# Patient Record
Sex: Male | Born: 1970 | Race: White | Hispanic: No | Marital: Single | Smoking: Never smoker
Health system: Southern US, Community
[De-identification: ages and names within clinical notes are randomized; demographics above are authoritative.]

## PROBLEM LIST (undated history)

## (undated) HISTORY — PX: TONSILLECTOMY: SUR1361

---

## 2001-08-05 ENCOUNTER — Encounter: Payer: Self-pay | Admitting: Emergency Medicine

## 2001-08-05 ENCOUNTER — Emergency Department (HOSPITAL_COMMUNITY): Admission: EM | Admit: 2001-08-05 | Discharge: 2001-08-05 | Payer: Self-pay | Admitting: Emergency Medicine

## 2004-03-25 ENCOUNTER — Ambulatory Visit (HOSPITAL_COMMUNITY)
Admission: RE | Admit: 2004-03-25 | Discharge: 2004-03-25 | Payer: Self-pay | Admitting: Physical Medicine and Rehabilitation

## 2005-01-27 ENCOUNTER — Ambulatory Visit: Payer: Self-pay | Admitting: Family Medicine

## 2020-07-21 ENCOUNTER — Ambulatory Visit (HOSPITAL_COMMUNITY)
Admission: RE | Admit: 2020-07-21 | Discharge: 2020-07-21 | Disposition: A | Discharge status: Home / Self Care | Payer: Self-pay | Source: Ambulatory Visit | Attending: Urology | Admitting: Urology

## 2020-07-21 ENCOUNTER — Encounter: Payer: Self-pay | Admitting: Urology

## 2020-07-21 ENCOUNTER — Other Ambulatory Visit: Payer: Self-pay

## 2020-07-21 ENCOUNTER — Ambulatory Visit (INDEPENDENT_AMBULATORY_CARE_PROVIDER_SITE_OTHER): Payer: Self-pay | Admitting: Urology

## 2020-07-21 VITALS — BP 128/81 | HR 73 | Temp 98.9°F | Ht 67.75 in | Wt 280.0 lb

## 2020-07-21 DIAGNOSIS — N200 Calculus of kidney: Secondary | ICD-10-CM

## 2020-07-21 LAB — URINALYSIS, ROUTINE W REFLEX MICROSCOPIC
Bilirubin, UA: NEGATIVE
Glucose, UA: NEGATIVE
Ketones, UA: NEGATIVE
Nitrite, UA: NEGATIVE
Specific Gravity, UA: 1.025 (ref 1.005–1.030)
Urobilinogen, Ur: 0.2 mg/dL (ref 0.2–1.0)
pH, UA: 5.5 (ref 5.0–7.5)

## 2020-07-21 LAB — MICROSCOPIC EXAMINATION: Renal Epithel, UA: NONE SEEN /hpf

## 2020-07-21 NOTE — Patient Instructions (Addendum)
Pre op at [facility] 07/22/2020 at 10:30 Surgery- 07/23/2020   Ureteroscopy Ureteroscopy is a procedure to check for and treat problems inside part of the urinary tract. In this procedure, a thin, tube-shaped instrument with a light at the end (ureteroscope) is used to look at the inside of the kidneys and the ureters, which are the tubes that carry urine from the kidneys to the bladder. The ureteroscope is inserted into one or both of the ureters. You may need this procedure if you have frequent urinary tract infections (UTIs), blood in your urine, or a stone in one of your ureters. A ureteroscopy can be done to find the cause of urine blockage in a ureter and to evaluate other abnormalities inside the ureters or kidneys. If stones are found, they can be removed during the procedure. Polyps, abnormal tissue, and some types of tumors can also be removed or treated. The ureteroscope may also have a tool to remove tissue to be checked for disease under a microscope (biopsy). Tell a health care provider about:  Any allergies you have.  All medicines you are taking, including vitamins, herbs, eye drops, creams, and over-the-counter medicines.  Any problems you or family members have had with anesthetic medicines.  Any blood disorders you have.  Any surgeries you have had.  Any medical conditions you have.  Whether you are pregnant or may be pregnant. What are the risks? Generally, this is a safe procedure. However, problems may occur, including:  Bleeding.  Infection.  Allergic reactions to medicines.  Scarring that narrows the ureter (stricture).  Creating a hole in the ureter (perforation). What happens before the procedure? Staying hydrated Follow instructions from your health care provider about hydration, which may include:  Up to 2 hours before the procedure - you may continue to drink clear liquids, such as water, clear fruit juice, black coffee, and plain tea. Eating and  drinking restrictions Follow instructions from your health care provider about eating and drinking, which may include:  8 hours before the procedure - stop eating heavy meals or foods such as meat, fried foods, or fatty foods.  6 hours before the procedure - stop eating light meals or foods, such as toast or cereal.  6 hours before the procedure - stop drinking milk or drinks that contain milk.  2 hours before the procedure - stop drinking clear liquids. Medicines  Ask your health care provider about: ? Changing or stopping your regular medicines. This is especially important if you are taking diabetes medicines or blood thinners. ? Taking medicines such as aspirin and ibuprofen. These medicines can thin your blood. Do not take these medicines before your procedure if your health care provider instructs you not to.  You may be given antibiotic medicine to help prevent infection. General instructions  You may have a urine sample taken to check for infection.  Plan to have someone take you home from the hospital or clinic. What happens during the procedure?   To reduce your risk of infection: ? Your health care team will wash or sanitize their hands. ? Your skin will be washed with soap.  An IV tube will be inserted into one of your veins.  You will be given one of the following: ? A medicine to help you relax (sedative). ? A medicine to make you fall asleep (general anesthetic). ? A medicine that is injected into your spine to numb the area below and slightly above the injection site (spinal anesthetic).  To  lower your risk of infection, you may be given an antibiotic medicine by an injection or through the IV tube.  The opening from which you urinate (urethra) will be cleaned with a germ-killing solution.  The ureteroscope will be passed through your urethra into your bladder.  A salt-water solution will flow through the ureteroscope to fill your bladder. This will help the  health care provider see the openings of your ureters more clearly.  Then, the ureteroscope will be passed into your ureter. ? If a growth is found, a piece of it may be removed so it can be examined under a microscope (biopsy). ? If a stone is found, it may be removed through the ureteroscope, or the stone may be broken up using a laser, shock waves, or electrical energy. ? In some cases, if the ureter is too small, a tube may be inserted that keeps the ureter open (ureteral stent). The stent may be left in place for 1 or 2 weeks to keep the ureter open, and then the ureteroscopy procedure will be performed.  The scope will be removed, and your bladder will be emptied. The procedure may vary among health care providers and hospitals. What happens after the procedure?  Your blood pressure, heart rate, breathing rate, and blood oxygen level will be monitored until the medicines you were given have worn off.  You may be asked to urinate.  Donot drive for 24 hours if you were given a sedative. This information is not intended to replace advice given to you by your health care provider. Make sure you discuss any questions you have with your health care provider. Document Revised: 06/23/2017 Document Reviewed: 04/22/2016 Elsevier Patient Education  2020 [employer].

## 2020-07-21 NOTE — Addendum Note (Signed)
Addended by: Dalia Heading on: 07/21/2020 04:17 PM   Modules accepted: Orders

## 2020-07-21 NOTE — Progress Notes (Signed)
Urological Symptom Review  Patient is experiencing the following symptoms: Kidney stones   Review of Systems  Gastrointestinal (upper)  : Negative for upper GI symptoms  Gastrointestinal (lower) : Negative for lower GI symptoms  Constitutional : Negative for symptoms  Skin: Negative for skin symptoms  Eyes: Negative for eye symptoms  Ear/Nose/Throat : Negative for Ear/Nose/Throat symptoms  Hematologic/Lymphatic: Negative for Hematologic/Lymphatic symptoms  Cardiovascular : Negative for cardiovascular symptoms  Respiratory : Negative for respiratory symptoms  Endocrine: Negative for endocrine symptoms  Musculoskeletal: Negative for musculoskeletal symptoms  Neurological: Negative for neurological symptoms  Psychologic: Negative for psychiatric symptoms  

## 2020-07-21 NOTE — Progress Notes (Signed)
07/21/2020 3:13 PM   [name] 1971/03/29 [id number]  Referring provider: No referring provider defined for this encounter.  Nephrolithiasis  HPI: William Anthony is a 49yo here for evaluation of nephrolithiasis. He has previously been seen in [place] for a 18mm right renal calculus and a 27mm left renal calculus. He has never had intervention for calculi in the past. The pain has been intermittent, sharp, mild to moderate and nonradiating for 2 years. He denies any LUTS. No gross hematuria. UA today shows 3-10 RBCs. No fevers. No other associated symptoms. Last imaging was a CT stone protocol from 01/23/2020.    PMH: History reviewed. No pertinent past medical history.  Surgical History: History reviewed. No pertinent surgical history.  Home Medications:  Allergies as of 07/21/2020   Not on File     Medication List    as of July 21, 2020  3:13 PM   You have not been prescribed any medications.     Allergies: Not on File  Family History: Family History  Problem Relation Age of Onset  . Heart attack Father     Social History:  reports that he has never smoked. He has never used smokeless tobacco. No history on file for alcohol use and drug use.  ROS: All other review of systems were reviewed and are negative except what is noted above in HPI  Physical Exam: BP 128/81   Pulse 73   Temp 98.9 F (37.2 C)   Ht 5' 7.75" (1.721 m)   Wt 280 lb (127 kg)   BMI 42.89 kg/m   Constitutional:  Alert and oriented, No acute distress. HEENT: [place] AT, moist mucus membranes.  Trachea midline, no masses. Cardiovascular: No clubbing, cyanosis, or edema. Respiratory: Normal respiratory effort, no increased work of breathing. GI: Abdomen is soft, nontender, nondistended, no abdominal masses GU: No CVA tenderness.  Lymph: No cervical or inguinal lymphadenopathy. Skin: No rashes, bruises or suspicious lesions. Neurologic: Grossly intact, no focal deficits, moving all 4  extremities. Psychiatric: Normal mood and affect.  Laboratory Data: No results found for: WBC, HGB, HCT, MCV, PLT  No results found for: CREATININE  No results found for: PSA  No results found for: TESTOSTERONE  No results found for: HGBA1C  Urinalysis No results found for: COLORURINE, APPEARANCEUR, LABSPEC, PHURINE, GLUCOSEU, HGBUR, BILIRUBINUR, KETONESUR, PROTEINUR, UROBILINOGEN, NITRITE, LEUKOCYTESUR  No results found for: LABMICR, WBCUA, RBCUA, LABEPIT, MUCUS, BACTERIA  Pertinent Imaging: [facility] today: Images reviewed and discussed with the patient No results found for this or any previous visit.  No results found for this or any previous visit.  No results found for this or any previous visit.  No results found for this or any previous visit.  No results found for this or any previous visit.  No results found for this or any previous visit.  No results found for this or any previous visit.  No results found for this or any previous visit.   Assessment & Plan:    1. Nephrolithiasis -We discussed the management of kidney stones. These options include observation, ureteroscopy, shockwave lithotripsy (ESWL) and percutaneous nephrolithotomy (PCNL). We discussed which options are relevant to the patient's stone(s). We discussed the natural history of kidney stones as well as the complications of untreated stones and the impact on quality of life without treatment as well as with each of the above listed treatments. We also discussed the efficacy of each treatment in its ability to clear the stone burden. With any of these  management options I discussed the signs and symptoms of infection and the need for emergent treatment should these be experienced. For each option we discussed the ability of each procedure to clear the patient of their stone burden.   For observation I described the risks which include but are not limited to silent renal damage, life-threatening infection,  need for emergent surgery, failure to pass stone and pain.   For ureteroscopy I described the risks which include bleeding, infection, damage to contiguous structures, positioning injury, ureteral stricture, ureteral avulsion, ureteral injury, need for prolonged ureteral stent, inability to perform ureteroscopy, need for an interval procedure, inability to clear stone burden, stent discomfort/pain, heart attack, stroke, pulmonary embolus and the inherent risks with general anesthesia.   For shockwave lithotripsy I described the risks which include arrhythmia, kidney contusion, kidney hemorrhage, need for transfusion, pain, inability to adequately break up stone, inability to pass stone fragments, Steinstrasse, infection associated with obstructing stones, need for alternate surgical procedure, need for repeat shockwave lithotripsy, MI, CVA, PE and the inherent risks with anesthesia/conscious sedation.   For PCNL I described the risks including positioning injury, pneumothorax, hydrothorax, need for chest tube, inability to clear stone burden, renal laceration, arterial venous fistula or malformation, need for embolization of kidney, loss of kidney or renal function, need for repeat procedure, need for prolonged nephrostomy tube, ureteral avulsion, MI, CVA, PE and the inherent risks of general anesthesia.   - The patient would like to proceed with right ureteroscopic stone extraction - Abdomen 1 view (KUB)   No follow-ups on file.  Wilkie Aye, MD  [facility] [place]

## 2020-07-22 ENCOUNTER — Other Ambulatory Visit (HOSPITAL_COMMUNITY)
Admission: RE | Admit: 2020-07-22 | Discharge: 2020-07-22 | Disposition: A | Discharge status: Home / Self Care | Payer: Self-pay | Source: Ambulatory Visit | Attending: Urology | Admitting: Urology

## 2020-07-22 ENCOUNTER — Encounter (HOSPITAL_COMMUNITY)
Admission: RE | Admit: 2020-07-22 | Discharge: 2020-07-22 | Disposition: A | Discharge status: Home / Self Care | Payer: Self-pay | Source: Ambulatory Visit | Attending: Urology | Admitting: Urology

## 2020-07-22 ENCOUNTER — Other Ambulatory Visit: Payer: Self-pay

## 2020-07-22 ENCOUNTER — Encounter (HOSPITAL_COMMUNITY): Payer: Self-pay

## 2020-07-22 DIAGNOSIS — Z20822 Contact with and (suspected) exposure to covid-19: Secondary | ICD-10-CM | POA: Insufficient documentation

## 2020-07-22 DIAGNOSIS — Z01812 Encounter for preprocedural laboratory examination: Secondary | ICD-10-CM | POA: Insufficient documentation

## 2020-07-22 NOTE — Patient Instructions (Signed)
William Anthony  07/22/2020     @PREFPERIOPPHARMACY @   Your procedure is scheduled on 07/23/2120.  Report to [facility] at [facility]:00 [facility].M.  Call this number if you have problems the morning of surgery:  [phone]   Remember:  Do not eat or drink after midnight.     Take these medicines the morning of surgery with A SIP OF WATER :     Do not wear jewelry, make-up or nail polish.  Do not wear lotions, powders, or perfumes, or deodorant.  Do not shave 48 hours prior to surgery.  Men may shave face and neck.  Do not bring valuables to the hospital.  [facility] is not responsible for any belongings or valuables.  Contacts, dentures or bridgework may not be worn into surgery.  Leave your suitcase in the car.  After surgery it may be brought to your room.  For patients admitted to the hospital, discharge time will be determined by your treatment team.  Patients discharged the day of surgery will not be allowed to drive home.   Name and phone number of your driver:   family Special instructions:  n/a  Please read over the following fact sheets that you were given. Care and Recovery After Surgery    Ureteral Stent Implantation  Ureteral stent implantation is a procedure to insert (implant) a flexible, soft, plastic tube (stent) into a ureter. Ureters are the tube-like parts of the body that drain urine from the kidneys. The stent supports the ureter while it heals and helps to drain urine. You may have a ureteral stent implanted after having a procedure to remove a blockage from the ureter (ureterolysis or pyeloplasty). You may also have a stent implanted to open the flow of urine when you have a blockage caused by a kidney stone, tumor, blood clot, or infection. You have two ureters, one on each side of the body. The ureters connect the kidneys to the organ that holds urine until it passes out of the body (bladder). The stent is placed so that one end is in the kidney, and one end is  in the bladder. The stent is usually taken out after your ureter has healed. Depending on your condition, you may have a stent for just a few weeks, or you may have a long-term stent that will need to be replaced every few months. Tell a health care provider about:  Any allergies you have.  All medicines you are taking, including vitamins, herbs, eye drops, creams, and over-the-counter medicines.  Any problems you or family members have had with anesthetic medicines.  Any blood disorders you have.  Any surgeries you have had.  Any medical conditions you have.  Whether you are pregnant or may be pregnant. What are the risks? Generally, this is a safe procedure. However, problems may occur, including:  Infection.  Bleeding.  Allergic reactions to medicines.  Damage to other structures or organs. Tearing (perforation) of the ureter is possible.  Movement of the stent away from where it is placed during surgery (migration). What happens before the procedure? Medicines Ask your health care provider about:  Changing or stopping your regular medicines. This is especially important if you are taking diabetes medicines or blood thinners.  Taking medicines such as aspirin and ibuprofen. These medicines can thin your blood. Do not take these medicines unless your health care provider tells you to take them.  Taking over-the-counter medicines, vitamins, herbs, and supplements. Eating and drinking Follow  instructions from your health care provider about eating and drinking, which may include:  8 hours before the procedure - stop eating heavy meals or foods, such as meat, fried foods, or fatty foods.  6 hours before the procedure - stop eating light meals or foods, such as toast or cereal.  6 hours before the procedure - stop drinking milk or drinks that contain milk.  2 hours before the procedure - stop drinking clear liquids. Staying hydrated Follow instructions from your health  care provider about hydration, which may include:  Up to 2 hours before the procedure - you may continue to drink clear liquids, such as water, clear fruit juice, black coffee, and plain tea. General instructions  Do not drink alcohol.  Do not use any products that contain nicotine or tobacco for at least 4 weeks before the procedure. These products include cigarettes, e-cigarettes, and chewing tobacco. If you need help quitting, ask your health care provider.  You may have an exam or testing, such as imaging or blood tests.  Ask your health care provider what steps will be taken to help prevent infection. These may include: ? Removing hair at the surgery site. ? Washing skin with a germ-killing soap. ? Taking antibiotic medicine.  Plan to have someone take you home from the hospital or clinic.  If you will be going home right after the procedure, plan to have someone with you for 24 hours. What happens during the procedure?  An IV will be inserted into one of your veins.  You may be given a medicine to help you relax (sedative).  You may be given a medicine to make you fall asleep (general anesthetic).  A thin, tube-shaped instrument with a light and tiny camera at the end (cystoscope) will be inserted into your urethra. The urethra is the tube that drains urine from the bladder out of the body. In men, the urethra opens at the end of the penis. In women, the urethra opens in front of the vaginal opening.  The cystoscope will be passed into your bladder.  A thin wire (guide wire) will be passed through your bladder and into your ureter. This is used to guide the stent into your ureter.  The stent will be inserted into your ureter.  The guide wire and the cystoscope will be removed.  A flexible tube (catheter) may be inserted through your urethra so that one end is in your bladder. This helps to drain urine from your bladder. The procedure may vary among hospitals and health  care providers. What happens after the procedure?  Your blood pressure, heart rate, breathing rate, and blood oxygen level will be monitored until you leave the hospital or clinic.  You may continue to receive medicine and fluids through an IV.  You may have some soreness or pain in your abdomen and urethra. Medicines will be available to help you.  You will be encouraged to get up and walk around as soon as you can.  You may have a catheter draining your urine.  You will have some blood in your urine.  Do not drive for 24 hours if you were given a sedative during your procedure. Summary  Ureteral stent implantation is a procedure to insert a flexible, soft, plastic tube (stent) into a ureter.  You may have a stent implanted to support the ureter while it heals after a procedure or to open the flow of urine if there is a blockage.  Follow instructions from  your health care provider about taking medicines and about eating and drinking before the procedure.  Depending on your condition, you may have a stent for just a few weeks, or you may have a long-term stent that will need to be replaced every few months. This information is not intended to replace advice given to you by your health care provider. Make sure you discuss any questions you have with your health care provider. Document Revised: 04/17/2018 Document Reviewed: 04/18/2018 Elsevier Patient Education  2020 [employer].  Ureteroscopy Ureteroscopy is a procedure to check for and treat problems inside part of the urinary tract. In this procedure, a thin, tube-shaped instrument with a light at the end (ureteroscope) is used to look at the inside of the kidneys and the ureters, which are the tubes that carry urine from the kidneys to the bladder. The ureteroscope is inserted into one or both of the ureters. You may need this procedure if you have frequent urinary tract infections (UTIs), blood in your urine, or a stone in one of  your ureters. A ureteroscopy can be done to find the cause of urine blockage in a ureter and to evaluate other abnormalities inside the ureters or kidneys. If stones are found, they can be removed during the procedure. Polyps, abnormal tissue, and some types of tumors can also be removed or treated. The ureteroscope may also have a tool to remove tissue to be checked for disease under a microscope (biopsy). Tell a health care provider about:  Any allergies you have.  All medicines you are taking, including vitamins, herbs, eye drops, creams, and over-the-counter medicines.  Any problems you or family members have had with anesthetic medicines.  Any blood disorders you have.  Any surgeries you have had.  Any medical conditions you have.  Whether you are pregnant or may be pregnant. What are the risks? Generally, this is a safe procedure. However, problems may occur, including:  Bleeding.  Infection.  Allergic reactions to medicines.  Scarring that narrows the ureter (stricture).  Creating a hole in the ureter (perforation). What happens before the procedure? Staying hydrated Follow instructions from your health care provider about hydration, which may include:  Up to 2 hours before the procedure - you may continue to drink clear liquids, such as water, clear fruit juice, black coffee, and plain tea. Eating and drinking restrictions Follow instructions from your health care provider about eating and drinking, which may include:  8 hours before the procedure - stop eating heavy meals or foods such as meat, fried foods, or fatty foods.  6 hours before the procedure - stop eating light meals or foods, such as toast or cereal.  6 hours before the procedure - stop drinking milk or drinks that contain milk.  2 hours before the procedure - stop drinking clear liquids. Medicines  Ask your health care provider about: ? Changing or stopping your regular medicines. This is especially  important if you are taking diabetes medicines or blood thinners. ? Taking medicines such as aspirin and ibuprofen. These medicines can thin your blood. Do not take these medicines before your procedure if your health care provider instructs you not to.  You may be given antibiotic medicine to help prevent infection. General instructions  You may have a urine sample taken to check for infection.  Plan to have someone take you home from the hospital or clinic. What happens during the procedure?   To reduce your risk of infection: ? Your health care  team will wash or sanitize their hands. ? Your skin will be washed with soap.  An IV tube will be inserted into one of your veins.  You will be given one of the following: ? A medicine to help you relax (sedative). ? A medicine to make you fall asleep (general anesthetic). ? A medicine that is injected into your spine to numb the area below and slightly above the injection site (spinal anesthetic).  To lower your risk of infection, you may be given an antibiotic medicine by an injection or through the IV tube.  The opening from which you urinate (urethra) will be cleaned with a germ-killing solution.  The ureteroscope will be passed through your urethra into your bladder.  A salt-water solution will flow through the ureteroscope to fill your bladder. This will help the health care provider see the openings of your ureters more clearly.  Then, the ureteroscope will be passed into your ureter. ? If a growth is found, a piece of it may be removed so it can be examined under a microscope (biopsy). ? If a stone is found, it may be removed through the ureteroscope, or the stone may be broken up using a laser, shock waves, or electrical energy. ? In some cases, if the ureter is too small, a tube may be inserted that keeps the ureter open (ureteral stent). The stent may be left in place for 1 or 2 weeks to keep the ureter open, and then the  ureteroscopy procedure will be performed.  The scope will be removed, and your bladder will be emptied. The procedure may vary among health care providers and hospitals. What happens after the procedure?  Your blood pressure, heart rate, breathing rate, and blood oxygen level will be monitored until the medicines you were given have worn off.  You may be asked to urinate.  Donot drive for 24 hours if you were given a sedative. This information is not intended to replace advice given to you by your health care provider. Make sure you discuss any questions you have with your health care provider. Document Revised: 06/23/2017 Document Reviewed: 04/22/2016 Elsevier Patient Education  2020 [employer]  Cystoscopy Cystoscopy is a procedure that is used to help diagnose and sometimes treat conditions that affect the lower urinary tract. The lower urinary tract includes the bladder and the urethra. The urethra is the tube that drains urine from the bladder. Cystoscopy is done using a thin, tube-shaped instrument with a light and camera at the end (cystoscope). The cystoscope may be hard or flexible, depending on the goal of the procedure. The cystoscope is inserted through the urethra, into the bladder. Cystoscopy may be recommended if you have:  Urinary tract infections that keep coming back.  Blood in the urine (hematuria).  An inability to control when you urinate (urinary incontinence) or an overactive bladder.  Unusual cells found in a urine sample.  A blockage in the urethra, such as a urinary stone.  Painful urination.  An abnormality in the bladder found during an intravenous pyelogram (IVP) or CT scan. Cystoscopy may also be done to remove a sample of tissue to be examined under a microscope (biopsy). Tell a health care provider about:  Any allergies you have.  All medicines you are taking, including vitamins, herbs, eye drops, creams, and over-the-counter medicines.  Any  problems you or family members have had with anesthetic medicines.  Any blood disorders you have.  Any surgeries you have had.  Any  medical conditions you have.  Whether you are pregnant or may be pregnant. What are the risks? Generally, this is a safe procedure. However, problems may occur, including:  Infection.  Bleeding.  Allergic reactions to medicines.  Damage to other structures or organs. What happens before the procedure?  Ask your health care provider about: ? Changing or stopping your regular medicines. This is especially important if you are taking diabetes medicines or blood thinners. ? Taking medicines such as aspirin and ibuprofen. These medicines can thin your blood. Do not take these medicines unless your health care provider tells you to take them. ? Taking over-the-counter medicines, vitamins, herbs, and supplements.  Follow instructions from your health care provider about eating or drinking restrictions.  Ask your health care provider what steps will be taken to help prevent infection. These may include: ? Washing skin with a germ-killing soap. ? Taking antibiotic medicine.  You may have an exam or testing, such as: ? X-rays of the bladder, urethra, or kidneys. ? Urine tests to check for signs of infection.  Plan to have someone take you home from the hospital or clinic. What happens during the procedure?   You will be given one or more of the following: ? A medicine to help you relax (sedative). ? A medicine to numb the area (local anesthetic).  The area around the opening of your urethra will be cleaned.  The cystoscope will be passed through your urethra into your bladder.  Germ-free (sterile) fluid will flow through the cystoscope to fill your bladder. The fluid will stretch your bladder so that your health care provider can clearly examine your bladder walls.  Your doctor will look at the urethra and bladder. Your doctor may take a biopsy or  remove stones.  The cystoscope will be removed, and your bladder will be emptied. The procedure may vary among health care providers and hospitals. What can I expect after the procedure? After the procedure, it is common to have:  Some soreness or pain in your abdomen and urethra.  Urinary symptoms. These include: ? Mild pain or burning when you urinate. Pain should stop within a few minutes after you urinate. This may last for up to 1 week. ? A small amount of blood in your urine for several days. ? Feeling like you need to urinate but producing only a small amount of urine. Follow these instructions at home: Medicines  Take over-the-counter and prescription medicines only as told by your health care provider.  If you were prescribed an antibiotic medicine, take it as told by your health care provider. Do not stop taking the antibiotic even if you start to feel better. General instructions  Return to your normal activities as told by your health care provider. Ask your health care provider what activities are safe for you.  Do not drive for 24 hours if you were given a sedative during your procedure.  Watch for any blood in your urine. If the amount of blood in your urine increases, call your health care provider.  Follow instructions from your health care provider about eating or drinking restrictions.  If a tissue sample was removed for testing (biopsy) during your procedure, it is up to you to get your test results. Ask your health care provider, or the department that is doing the test, when your results will be ready.  Drink enough fluid to keep your urine pale yellow.  Keep all follow-up visits as told by your health care provider.  This is important. Contact a health care provider if you:  Have pain that gets worse or does not get better with medicine, especially pain when you urinate.  Have trouble urinating.  Have more blood in your urine. Get help right away if  you:  Have blood clots in your urine.  Have abdominal pain.  Have a fever or chills.  Are unable to urinate. Summary  Cystoscopy is a procedure that is used to help diagnose and sometimes treat conditions that affect the lower urinary tract.  Cystoscopy is done using a thin, tube-shaped instrument with a light and camera at the end.  After the procedure, it is common to have some soreness or pain in your abdomen and urethra.  Watch for any blood in your urine. If the amount of blood in your urine increases, call your health care provider.  If you were prescribed an antibiotic medicine, take it as told by your health care provider. Do not stop taking the antibiotic even if you start to feel better. This information is not intended to replace advice given to you by your health care provider. Make sure you discuss any questions you have with your health care provider. Document Revised: 07/03/2018 Document Reviewed: 07/03/2018 Elsevier Patient Education  2020 [employer].

## 2020-07-23 LAB — SARS CORONAVIRUS 2 (TAT 6-24 HRS): SARS Coronavirus 2: NEGATIVE

## 2020-08-27 NOTE — Patient Instructions (Signed)
William Anthony  08/27/2020     @PREFPERIOPPHARMACY @   Your procedure is scheduled on  09/03/2020   Report to [facility] at  1100   A.M.   Call this number if you have problems the morning of surgery:  [phone]     Remember:  Do not eat or drink after midnight.                        Take these medicines the morning of surgery with A SIP OF WATER    NONE    Please brush your teeth.  Do not wear jewelry, make-up or nail polish.  Do not wear lotions, powders, or perfumes, or deodorant.  Do not shave 48 hours prior to surgery.  Men may shave face and neck.  Do not bring valuables to the hospital.  [facility] is not responsible for any belongings or valuables.  Contacts, dentures or bridgework may not be worn into surgery.  Leave your suitcase in the car.  After surgery it may be brought to your room.  For patients admitted to the hospital, discharge time will be determined by your treatment team.  Patients discharged the day of surgery will not be allowed to drive home and must have someone with them for 24 hours.   Special instructions:  DO NOT smoke tobacco or vape the morning of your surgery.   Please read over the following fact sheets that you were given. Coughing and Deep Breathing, Surgical Site Infection Prevention, Anesthesia Post-op Instructions and Care and Recovery After Surgery       Ureteral Stent Implantation, Care After This sheet gives you information about how to care for yourself after your procedure. Your health care provider may also give you more specific instructions. If you have problems or questions, contact your health care provider. What can I expect after the procedure? After the procedure, it is common to have:  Nausea.  Mild pain when you urinate. You may feel this pain in your lower back or lower abdomen. The pain should stop within a few minutes after you urinate. This may last for up to 1 week.  A small amount of  blood in your urine for several days. Follow these instructions at home: Medicines  Take over-the-counter and prescription medicines only as told by your health care provider.  If you were prescribed an antibiotic medicine, take it as told by your health care provider. Do not stop taking the antibiotic even if you start to feel better.  Do not drive for 24 hours if you were given a sedative during your procedure.  Ask your health care provider if the medicine prescribed to you requires you to avoid driving or using heavy machinery. Activity  Rest as told by your health care provider.  Avoid sitting for a long time without moving. Get up to take short walks every 1-2 hours. This is important to improve blood flow and breathing. Ask for help if you feel weak or unsteady.  Return to your normal activities as told by your health care provider. Ask your health care provider what activities are safe for you. General instructions  Watch for any blood in your urine. Call your health care provider if the amount of blood in your urine increases.  If you have a catheter: ? Follow instructions from your health care provider about taking care of your catheter and collection bag. ? Do  not take baths, swim, or use a hot tub until your health care provider approves. Ask your health care provider if you may take showers. You may only be allowed to take sponge baths.  Drink enough fluid to keep your urine pale yellow.  Do not use any products that contain nicotine or tobacco, such as cigarettes, e-cigarettes, and chewing tobacco. These can delay healing after surgery. If you need help quitting, ask your health care provider.  Keep all follow-up visits as told by your health care provider. This is important.   Contact a health care provider if:  You have pain that gets worse or does not get better with medicine, especially pain when you urinate.  You have difficulty urinating.  You feel nauseous or  you vomit repeatedly during a period of more than 2 days after the procedure. Get help right away if:  Your urine is dark red or has blood clots in it.  You are leaking urine (have incontinence).  The end of the stent comes out of your urethra.  You cannot urinate.  You have sudden, sharp, or severe pain in your abdomen or lower back.  You have a fever.  You have swelling or pain in your legs.  You have difficulty breathing. Summary  After the procedure, it is common to have mild pain when you urinate that goes away within a few minutes after you urinate. This may last for up to 1 week.  Watch for any blood in your urine. Call your health care provider if the amount of blood in your urine increases.  Take over-the-counter and prescription medicines only as told by your health care provider.  Drink enough fluid to keep your urine pale yellow. This information is not intended to replace advice given to you by your health care provider. Make sure you discuss any questions you have with your health care provider. Document Revised: 04/17/2018 Document Reviewed: 04/18/2018 Elsevier Patient Education  2021 [employer] Monitored Anesthesia Care, Care After This sheet gives you information about how to care for yourself after your procedure. Your health care provider may also give you more specific instructions. If you have problems or questions, contact your health care provider. What can I expect after the procedure? After the procedure, it is common to have:  Tiredness.  Forgetfulness about what happened after the procedure.  Impaired judgment for important decisions.  Nausea or vomiting.  Some difficulty with balance. Follow these instructions at home: For the time period you were told by your health care provider:  Rest as needed.  Do not participate in activities where you could fall or become injured.  Do not drive or use machinery.  Do not drink alcohol.  Do  not take sleeping pills or medicines that cause drowsiness.  Do not make important decisions or sign legal documents.  Do not take care of children on your own.      Eating and drinking  Follow the diet that is recommended by your health care provider.  Drink enough fluid to keep your urine pale yellow.  If you vomit: ? Drink water, juice, or soup when you can drink without vomiting. ? Make sure you have little or no nausea before eating solid foods. General instructions  Have a responsible adult stay with you for the time you are told. It is important to have someone help care for you until you are awake and alert.  Take over-the-counter and prescription medicines only as told by your health  care provider.  If you have sleep apnea, surgery and certain medicines can increase your risk for breathing problems. Follow instructions from your health care provider about wearing your sleep device: ? Anytime you are sleeping, including during daytime naps. ? While taking prescription pain medicines, sleeping medicines, or medicines that make you drowsy.  Avoid smoking.  Keep all follow-up visits as told by your health care provider. This is important. Contact a health care provider if:  You keep feeling nauseous or you keep vomiting.  You feel light-headed.  You are still sleepy or having trouble with balance after 24 hours.  You develop a rash.  You have a fever.  You have redness or swelling around the IV site. Get help right away if:  You have trouble breathing.  You have new-onset confusion at home. Summary  For several hours after your procedure, you may feel tired. You may also be forgetful and have poor judgment.  Have a responsible adult stay with you for the time you are told. It is important to have someone help care for you until you are awake and alert.  Rest as told. Do not drive or operate machinery. Do not drink alcohol or take sleeping pills.  Get help  right away if you have trouble breathing, or if you suddenly become confused. This information is not intended to replace advice given to you by your health care provider. Make sure you discuss any questions you have with your health care provider. Document Revised: 03/26/2020 Document Reviewed: 06/13/2019 Elsevier Patient Education  2021 [employer].

## 2020-09-01 ENCOUNTER — Other Ambulatory Visit: Payer: Self-pay

## 2020-09-01 ENCOUNTER — Encounter (HOSPITAL_COMMUNITY)
Admission: RE | Admit: 2020-09-01 | Discharge: 2020-09-01 | Disposition: A | Discharge status: Home / Self Care | Payer: Self-pay | Source: Ambulatory Visit | Attending: Urology | Admitting: Urology

## 2020-09-01 ENCOUNTER — Other Ambulatory Visit (HOSPITAL_COMMUNITY)
Admission: RE | Admit: 2020-09-01 | Discharge: 2020-09-01 | Disposition: A | Discharge status: Home / Self Care | Payer: Self-pay | Source: Ambulatory Visit | Attending: Urology | Admitting: Urology

## 2020-09-01 DIAGNOSIS — Z20822 Contact with and (suspected) exposure to covid-19: Secondary | ICD-10-CM | POA: Insufficient documentation

## 2020-09-01 DIAGNOSIS — Z01812 Encounter for preprocedural laboratory examination: Secondary | ICD-10-CM | POA: Insufficient documentation

## 2020-09-01 LAB — SARS CORONAVIRUS 2 (TAT 6-24 HRS): SARS Coronavirus 2: NEGATIVE

## 2020-09-03 DIAGNOSIS — N200 Calculus of kidney: Secondary | ICD-10-CM

## 2020-09-04 ENCOUNTER — Encounter (HOSPITAL_COMMUNITY): Payer: Self-pay | Admitting: Urology

## 2020-09-04 ENCOUNTER — Ambulatory Visit (HOSPITAL_COMMUNITY): Payer: Self-pay | Admitting: Anesthesiology

## 2020-09-04 ENCOUNTER — Ambulatory Visit (HOSPITAL_COMMUNITY)
Admission: RE | Admit: 2020-09-04 | Discharge: 2020-09-04 | Disposition: A | Discharge status: Home / Self Care | Payer: Self-pay | Attending: Urology | Admitting: Urology

## 2020-09-04 ENCOUNTER — Other Ambulatory Visit: Payer: Self-pay

## 2020-09-04 ENCOUNTER — Ambulatory Visit (HOSPITAL_COMMUNITY): Payer: Self-pay

## 2020-09-04 ENCOUNTER — Encounter (HOSPITAL_COMMUNITY)
Admission: RE | Disposition: A | Discharge status: Home / Self Care | Payer: Self-pay | Source: Home / Self Care | Attending: Urology

## 2020-09-04 DIAGNOSIS — N200 Calculus of kidney: Secondary | ICD-10-CM | POA: Insufficient documentation

## 2020-09-04 HISTORY — PX: HOLMIUM LASER APPLICATION: SHX5852

## 2020-09-04 HISTORY — PX: CYSTOSCOPY WITH RETROGRADE PYELOGRAM, URETEROSCOPY AND STENT PLACEMENT: SHX5789

## 2020-09-04 SURGERY — CYSTOURETEROSCOPY, WITH RETROGRADE PYELOGRAM AND STENT INSERTION
Anesthesia: General | Site: Ureter | Laterality: Right

## 2020-09-04 MED ORDER — FENTANYL CITRATE (PF) 100 MCG/2ML IJ SOLN
INTRAMUSCULAR | Status: AC
  Filled 2020-09-04: qty 2

## 2020-09-04 MED ORDER — MIDAZOLAM HCL 5 MG/5ML IJ SOLN
INTRAMUSCULAR | Status: DC | PRN
  Administered 2020-09-04: 2 mg via INTRAVENOUS

## 2020-09-04 MED ORDER — SODIUM CHLORIDE 0.9 % IR SOLN
Status: DC | PRN
  Administered 2020-09-04 (×2): 1000 mL

## 2020-09-04 MED ORDER — FENTANYL CITRATE (PF) 100 MCG/2ML IJ SOLN
INTRAMUSCULAR | Status: DC | PRN
  Administered 2020-09-04: 50 ug via INTRAVENOUS
  Administered 2020-09-04: 100 ug via INTRAVENOUS
  Administered 2020-09-04: 50 ug via INTRAVENOUS

## 2020-09-04 MED ORDER — CEFAZOLIN SODIUM-DEXTROSE 2-4 GM/100ML-% IV SOLN
2.0000 g | INTRAVENOUS | Status: AC
  Administered 2020-09-04: 2 g via INTRAVENOUS

## 2020-09-04 MED ORDER — HYDROMORPHONE HCL 1 MG/ML IJ SOLN
0.2500 mg | INTRAMUSCULAR | Status: DC | PRN
Start: 2020-09-04 — End: 2020-09-04

## 2020-09-04 MED ORDER — MIDAZOLAM HCL 2 MG/2ML IJ SOLN
INTRAMUSCULAR | Status: AC
  Filled 2020-09-04: qty 2

## 2020-09-04 MED ORDER — TAMSULOSIN HCL 0.4 MG PO CAPS: 0.4000 mg | ORAL_CAPSULE | Freq: Every day | ORAL | 1 refills | Status: DC

## 2020-09-04 MED ORDER — CEFAZOLIN SODIUM-DEXTROSE 2-4 GM/100ML-% IV SOLN
INTRAVENOUS | Status: AC
  Filled 2020-09-04: qty 100

## 2020-09-04 MED ORDER — DEXAMETHASONE SODIUM PHOSPHATE 10 MG/ML IJ SOLN
INTRAMUSCULAR | Status: AC
  Filled 2020-09-04: qty 1

## 2020-09-04 MED ORDER — ONDANSETRON HCL 4 MG PO TABS: 4.0000 mg | ORAL_TABLET | Freq: Every day | ORAL | 1 refills | Status: DC | PRN

## 2020-09-04 MED ORDER — ONDANSETRON HCL 4 MG/2ML IJ SOLN: 4.0000 mg | Freq: Once | INTRAMUSCULAR | Status: DC | PRN

## 2020-09-04 MED ORDER — LACTATED RINGERS IV SOLN: INTRAVENOUS | Status: DC

## 2020-09-04 MED ORDER — DIATRIZOATE MEGLUMINE 30 % UR SOLN
URETHRAL | Status: DC | PRN
  Administered 2020-09-04: 5 mL via URETHRAL

## 2020-09-04 MED ORDER — CHLORHEXIDINE GLUCONATE 0.12 % MT SOLN
15.0000 mL | Freq: Once | OROMUCOSAL | Status: AC
  Administered 2020-09-04: 15 mL via OROMUCOSAL

## 2020-09-04 MED ORDER — PROPOFOL 10 MG/ML IV BOLUS
INTRAVENOUS | Status: DC | PRN
  Administered 2020-09-04: 200 mg via INTRAVENOUS

## 2020-09-04 MED ORDER — ORAL CARE MOUTH RINSE: 15.0000 mL | Freq: Once | OROMUCOSAL | Status: AC

## 2020-09-04 MED ORDER — WATER FOR IRRIGATION, STERILE IR SOLN
Status: DC | PRN
  Administered 2020-09-04: 500 mL

## 2020-09-04 MED ORDER — ONDANSETRON HCL 4 MG/2ML IJ SOLN
INTRAMUSCULAR | Status: DC | PRN
  Administered 2020-09-04: 4 mg via INTRAVENOUS

## 2020-09-04 MED ORDER — DEXAMETHASONE SODIUM PHOSPHATE 10 MG/ML IJ SOLN
INTRAMUSCULAR | Status: DC | PRN
  Administered 2020-09-04: 10 mg via INTRAVENOUS

## 2020-09-04 MED ORDER — OXYCODONE-ACETAMINOPHEN 5-325 MG PO TABS: 1.0000 | ORAL_TABLET | ORAL | 0 refills | Status: DC | PRN

## 2020-09-04 MED ORDER — ONDANSETRON HCL 4 MG/2ML IJ SOLN
INTRAMUSCULAR | Status: AC
  Filled 2020-09-04: qty 2

## 2020-09-04 MED ORDER — DIATRIZOATE MEGLUMINE 30 % UR SOLN
URETHRAL | Status: AC
  Filled 2020-09-04: qty 100

## 2020-09-04 SURGICAL SUPPLY — 26 items
BAG DRAIN URO TABLE W/ADPT NS (BAG) ×2 IMPLANT
BAG DRN 8 ADPR NS SKTRN CSTL (BAG) ×1
BAG HAMPER (MISCELLANEOUS) ×2 IMPLANT
CATH INTERMIT  6FR 70CM (CATHETERS) ×2 IMPLANT
CLOTH BEACON ORANGE TIMEOUT ST (SAFETY) ×2 IMPLANT
DECANTER SPIKE VIAL GLASS SM (MISCELLANEOUS) ×2 IMPLANT
EXTRACTOR STONE NITINOL NGAGE (UROLOGICAL SUPPLIES) IMPLANT
GLOVE BIO SURGEON STRL SZ8 (GLOVE) ×2 IMPLANT
GLOVE SURG UNDER POLY LF SZ7 (GLOVE) ×6 IMPLANT
GOWN STRL REUS W/TWL LRG LVL3 (GOWN DISPOSABLE) ×2 IMPLANT
GOWN STRL REUS W/TWL XL LVL3 (GOWN DISPOSABLE) ×2 IMPLANT
GUIDEWIRE STR DUAL SENSOR (WIRE) ×2 IMPLANT
GUIDEWIRE STR ZIPWIRE 035X150 (MISCELLANEOUS) ×2 IMPLANT
IV NS IRRIG 3000ML ARTHROMATIC (IV SOLUTION) ×4 IMPLANT
KIT TURNOVER CYSTO (KITS) ×2 IMPLANT
MANIFOLD NEPTUNE II (INSTRUMENTS) ×2 IMPLANT
PACK CYSTO (CUSTOM PROCEDURE TRAY) ×2 IMPLANT
PAD ARMBOARD 7.5X6 YLW CONV (MISCELLANEOUS) ×2 IMPLANT
SHEATH URETERAL 12FRX35CM (MISCELLANEOUS) ×2 IMPLANT
STENT URET 6FRX26 CONTOUR (STENTS) ×2 IMPLANT
SYR 10ML LL (SYRINGE) ×2 IMPLANT
SYR CONTROL 10ML LL (SYRINGE) ×2 IMPLANT
TOWEL OR 17X26 4PK STRL BLUE (TOWEL DISPOSABLE) ×2 IMPLANT
TRACTIP FLEXIVA PULS ID 200XHI (Laser) ×1 IMPLANT
TRACTIP FLEXIVA PULSE ID 200 (Laser) ×2
WATER STERILE IRR 500ML POUR (IV SOLUTION) ×2 IMPLANT

## 2020-09-04 NOTE — H&P (Signed)
Nephrolithiasis  HPI: William Anthony is a 50yo here for evaluation of nephrolithiasis. He has previously been seen in [place] for a 50mm right renal calculus and a 50mm left renal calculus. He has never had intervention for calculi in the past. The pain has been intermittent, sharp, mild to moderate and nonradiating for 2 years. He denies any LUTS. No gross hematuria. UA today shows 3-10 RBCs. No fevers. No other associated symptoms. Last imaging was a CT stone protocol from 01/23/2020.    PMH: History reviewed. No pertinent past medical history.  Surgical History: History reviewed. No pertinent surgical history.  Home Medications:  Allergies as of 07/21/2020   Not on File     Medication List    as of July 21, 2020  3:13 PM   You have not been prescribed any medications.     Allergies: Not on File  Family History:      Family History  Problem Relation Age of Onset  . Heart attack Father     Social History:  reports that he has never smoked. He has never used smokeless tobacco. No history on file for alcohol use and drug use.  ROS: All other review of systems were reviewed and are negative except what is noted above in HPI  Physical Exam: BP 128/81   Pulse 73   Temp 98.9 F (37.2 C)   Ht 5' 7.75" (1.721 m)   Wt 280 lb (127 kg)   BMI 42.89 kg/m   Constitutional:  Alert and oriented, No acute distress. HEENT: [place] AT, moist mucus membranes.  Trachea midline, no masses. Cardiovascular: No clubbing, cyanosis, or edema. Respiratory: Normal respiratory effort, no increased work of breathing. GI: Abdomen is soft, nontender, nondistended, no abdominal masses GU: No CVA tenderness.  Lymph: No cervical or inguinal lymphadenopathy. Skin: No rashes, bruises or suspicious lesions. Neurologic: Grossly intact, no focal deficits, moving all 4 extremities. Psychiatric: Normal mood and affect.  Laboratory Data: Recent Labs  No results found for: WBC, HGB,  HCT, MCV, PLT    Recent Labs  No results found for: CREATININE    Recent Labs  No results found for: PSA    Recent Labs  No results found for: TESTOSTERONE    Recent Labs  No results found for: HGBA1C    Urinalysis Labs (Brief)  No results found for: COLORURINE, APPEARANCEUR, LABSPEC, PHURINE, GLUCOSEU, HGBUR, BILIRUBINUR, KETONESUR, PROTEINUR, UROBILINOGEN, NITRITE, LEUKOCYTESUR    Recent Labs  No results found for: LABMICR, WBCUA, RBCUA, LABEPIT, MUCUS, BACTERIA    Pertinent Imaging: KUb today: Images reviewed and discussed with the patient No results found for this or any previous visit.  No results found for this or any previous visit.  No results found for this or any previous visit.  No results found for this or any previous visit.  No results found for this or any previous visit.  No results found for this or any previous visit.  No results found for this or any previous visit.  No results found for this or any previous visit.   Assessment & Plan:    1. Nephrolithiasis -We discussed the management of kidney stones. These options include observation, ureteroscopy, shockwave lithotripsy (ESWL) and percutaneous nephrolithotomy (PCNL). We discussed which options are relevant to the patient's stone(s). We discussed the natural history of kidney stones as well as the complications of untreated stones and the impact on quality of life without treatment as well as with each of the above listed treatments. We also  discussed the efficacy of each treatment in its ability to clear the stone burden. With any of these management options I discussed the signs and symptoms of infection and the need for emergent treatment should these be experienced. For each option we discussed the ability of each procedure to clear the patient of their stone burden.   For observation I described the risks which include but are not limited to silent renal damage,  life-threatening infection, need for emergent surgery, failure to pass stone and pain.   For ureteroscopy I described the risks which include bleeding, infection, damage to contiguous structures, positioning injury, ureteral stricture, ureteral avulsion, ureteral injury, need for prolonged ureteral stent, inability to perform ureteroscopy, need for an interval procedure, inability to clear stone burden, stent discomfort/pain, heart attack, stroke, pulmonary embolus and the inherent risks with general anesthesia.   For shockwave lithotripsy I described the risks which include arrhythmia, kidney contusion, kidney hemorrhage, need for transfusion, pain, inability to adequately break up stone, inability to pass stone fragments, Steinstrasse, infection associated with obstructing stones, need for alternate surgical procedure, need for repeat shockwave lithotripsy, MI, CVA, PE and the inherent risks with anesthesia/conscious sedation.   For PCNL I described the risks including positioning injury, pneumothorax, hydrothorax, need for chest tube, inability to clear stone burden, renal laceration, arterial venous fistula or malformation, need for embolization of kidney, loss of kidney or renal function, need for repeat procedure, need for prolonged nephrostomy tube, ureteral avulsion, MI, CVA, PE and the inherent risks of general anesthesia.   - The patient would like to proceed with right ureteroscopic stone extraction - Abdomen 1 view (KUB)   No follow-ups on file.  Wilkie Aye, MD  [facility] [place]

## 2020-09-04 NOTE — Anesthesia Preprocedure Evaluation (Signed)
Anesthesia Evaluation  Patient identified by MRN, date of birth, ID band Patient awake    Reviewed: Allergy & Precautions, H&P , NPO status , Patient's Chart, lab work & pertinent test results, reviewed documented beta blocker date and time   Airway Mallampati: II  TM Distance: >3 FB Neck ROM: full    Dental no notable dental hx.    Pulmonary neg pulmonary ROS,    Pulmonary exam normal breath sounds clear to auscultation       Cardiovascular Exercise Tolerance: Good negative cardio ROS   Rhythm:regular Rate:Normal     Neuro/Psych negative neurological ROS  negative psych ROS   GI/Hepatic negative GI ROS, Neg liver ROS,   Endo/Other  Morbid obesity  Renal/GU Renal disease  negative genitourinary   Musculoskeletal   Abdominal   Peds  Hematology negative hematology ROS (+)   Anesthesia Other Findings   Reproductive/Obstetrics negative OB ROS                             Anesthesia Physical Anesthesia Plan  ASA: III  Anesthesia Plan: General   Post-op Pain Management:    Induction:   PONV Risk Score and Plan:   Airway Management Planned:   Additional Equipment:   Intra-op Plan:   Post-operative Plan:   Informed Consent: I have reviewed the patients History and Physical, chart, labs and discussed the procedure including the risks, benefits and alternatives for the proposed anesthesia with the patient or authorized representative who has indicated his/her understanding and acceptance.     Dental Advisory Given  Plan Discussed with: CRNA  Anesthesia Plan Comments:         Anesthesia Quick Evaluation

## 2020-09-04 NOTE — Anesthesia Postprocedure Evaluation (Signed)
Anesthesia Post Note  Patient: William Anthony  Procedure(s) Performed: CYSTOSCOPY WITH RETROGRADE PYELOGRAM, URETEROSCOPY AND STENT PLACEMENT (Right Ureter) HOLMIUM LASER APPLICATION (Right Ureter)  Patient location during evaluation: PACU Anesthesia Type: General Level of consciousness: awake, oriented, awake and alert and patient cooperative Pain management: satisfactory to patient Vital Signs Assessment: post-procedure vital signs reviewed and stable Respiratory status: spontaneous breathing, respiratory function stable and nonlabored ventilation Cardiovascular status: stable Postop Assessment: no apparent nausea or vomiting Anesthetic complications: no   No complications documented.   Last Vitals:  Vitals:   09/04/20 1144  BP: 138/85  Pulse: 79  Resp: (!) 22  Temp: 37.2 C  SpO2: 97%    Last Pain:  Vitals:   09/04/20 1144  TempSrc: Oral  PainSc: 2                  Porshia Blizzard

## 2020-09-04 NOTE — Progress Notes (Signed)
Left office voicemail to schedule a 2 week stent removal appointment for patient and call patient with time.

## 2020-09-04 NOTE — Op Note (Signed)
.  Preoperative diagnosis: Right renal stone  Postoperative diagnosis: Same  Procedure: 1 cystoscopy 2.  Right retrograde pyelography 3.  Intraoperative fluoroscopy, under one hour, with interpretation 4.  Right ureteroscopic stone manipulation with laser lithotripsy 5.  Right 6 x 26 JJ stent placement  Attending: Cleda Mccreedy  Anesthesia: General  Estimated blood loss: None  Drains: Right 6 x 26 JJ ureteral stent without tether  Specimens: stone for analysis  Antibiotics: ancef  Findings: right mid pole stone. No hydronephrosis. No masses/lesions in the bladder. Ureteral orifices in normal anatomic location.  Indications: Patient is a 50 year old male/male with a history of right renal stone and who has persistent right flank pain.  After discussing treatment options, he decided proceed with right ureteroscopic stone manipulation.  Procedure her in detail: The patient was brought to the operating room and a brief timeout was done to ensure correct patient, correct procedure, correct site.  General anesthesia was administered patient was placed in dorsal lithotomy position.  Her genitalia was then prepped and draped in usual sterile fashion.  A rigid 22 French cystoscope was passed in the urethra and the bladder.  Bladder was inspected free masses or lesions.  the ureteral orifices were in the normal orthotopic locations.  a 6 french ureteral catheter was then instilled into the rightt ureteral orifice.  a gentle retrograde was obtained and findings noted above.  we then placed a zip wire through the ureteral catheter and advanced up to the renal pelvis.  we then removed the cystoscope and cannulated the right ureteral orifice with a semirigid ureteroscope.  No stone was found in the ureter. Once we reached the UPJ a sensor wire was advanced in to the renal pelvis. We then removed the ureteroscope and advanced am 12/14 x 38cm access sheath up to the renal pelvis. We then used the  flexible ureteroscope to perform nephroscopy. We encountered the stone in the mid pole.  Using a 242nm laser fiber the stone was dusted.  The large fragments were then removed with a Ngage basket.    once all stone fragments were removed we then removed the access sheath under direct vision and noted no injury to the ureter. We then placed a 6 x 26 double-j ureteral stent over the original zip wire.  We then removed the wire and good coil was noted in the the renal pelvis under fluoroscopy and the bladder under direct vision. the bladder was then drained and this concluded the procedure which was well tolerated by patient.  Complications: None  Condition: Stable, extubated, transferred to PACU  Plan: Patient is to be discharged home as to follow-up in 2 week for stent removal.

## 2020-09-04 NOTE — Transfer of Care (Signed)
Immediate Anesthesia Transfer of Care Note  Patient: William Anthony  Procedure(s) Performed: CYSTOSCOPY WITH RETROGRADE PYELOGRAM, URETEROSCOPY AND STENT PLACEMENT (Right Ureter) HOLMIUM LASER APPLICATION (Right Ureter)  Patient Location: PACU  Anesthesia Type:General  Level of Consciousness: awake, alert , oriented and patient cooperative  Airway & Oxygen Therapy: Patient Spontanous Breathing and Patient connected to nasal cannula oxygen  Post-op Assessment: Report given to RN, Post -op Vital signs reviewed and stable and Patient moving all extremities X 4  Post vital signs: Reviewed and stable  Last Vitals:  Vitals Value Taken Time  BP 128/85 09/04/20 1448  Temp    Pulse 92 09/04/20 1450  Resp 17 09/04/20 1450  SpO2 92 % 09/04/20 1450  Vitals shown include unvalidated device data.  Last Pain:  Vitals:   09/04/20 1144  TempSrc: Oral  PainSc: 2       Patients Stated Pain Goal: 8 (09/04/20 1144)  Complications: No complications documented.

## 2020-09-04 NOTE — Anesthesia Procedure Notes (Signed)
Procedure Name: LMA Insertion Date/Time: 09/04/2020 1:10 PM Performed by: Shanon Payor, CRNA Pre-anesthesia Checklist: Patient identified, Emergency Drugs available, Suction available, Patient being monitored and Timeout performed Patient Re-evaluated:Patient Re-evaluated prior to induction Oxygen Delivery Method: Circle system utilized Preoxygenation: Pre-oxygenation with 100% oxygen Induction Type: IV induction LMA: LMA inserted LMA Size: 4.0 Number of attempts: 1 Placement Confirmation: positive ETCO2 and breath sounds checked- equal and bilateral Tube secured with: Tape Dental Injury: Teeth and Oropharynx as per pre-operative assessment

## 2020-09-04 NOTE — Discharge Instructions (Signed)
Ureteral Stent Implantation, Care After This sheet gives you information about how to care for yourself after your procedure. Your health care provider may also give you more specific instructions. If you have problems or questions, contact your health care provider. What can I expect after the procedure? After the procedure, it is common to have:  Nausea.  Mild pain when you urinate. You may feel this pain in your lower back or lower abdomen. The pain should stop within a few minutes after you urinate. This may last for up to 1 week.  A small amount of blood in your urine for several days. Follow these instructions at home: Medicines  Take over-the-counter and prescription medicines only as told by your health care provider.  If you were prescribed an antibiotic medicine, take it as told by your health care provider. Do not stop taking the antibiotic even if you start to feel better.  Do not drive for 24 hours if you were given a sedative during your procedure.  Ask your health care provider if the medicine prescribed to you requires you to avoid driving or using heavy machinery. Activity  Rest as told by your health care provider.  Avoid sitting for a long time without moving. Get up to take short walks every 1-2 hours. This is important to improve blood flow and breathing. Ask for help if you feel weak or unsteady.  Return to your normal activities as told by your health care provider. Ask your health care provider what activities are safe for you. General instructions  Watch for any blood in your urine. Call your health care provider if the amount of blood in your urine increases.  If you have a catheter: ? Follow instructions from your health care provider about taking care of your catheter and collection bag. ? Do not take baths, swim, or use a hot tub until your health care provider approves. Ask your health care provider if you may take showers. You may only be allowed to  take sponge baths.  Drink enough fluid to keep your urine pale yellow.  Do not use any products that contain nicotine or tobacco, such as cigarettes, e-cigarettes, and chewing tobacco. These can delay healing after surgery. If you need help quitting, ask your health care provider.  Keep all follow-up visits as told by your health care provider. This is important.   Contact a health care provider if:  You have pain that gets worse or does not get better with medicine, especially pain when you urinate.  You have difficulty urinating.  You feel nauseous or you vomit repeatedly during a period of more than 2 days after the procedure. Get help right away if:  Your urine is dark red or has blood clots in it.  You are leaking urine (have incontinence).  The end of the stent comes out of your urethra.  You cannot urinate.  You have sudden, sharp, or severe pain in your abdomen or lower back.  You have a fever.  You have swelling or pain in your legs.  You have difficulty breathing. Summary  After the procedure, it is common to have mild pain when you urinate that goes away within a few minutes after you urinate. This may last for up to 1 week.  Watch for any blood in your urine. Call your health care provider if the amount of blood in your urine increases.  Take over-the-counter and prescription medicines only as told by your health care provider.  Drink enough fluid to keep your urine pale yellow. This information is not intended to replace advice given to you by your health care provider. Make sure you discuss any questions you have with your health care provider. Document Revised: 04/17/2018 Document Reviewed: 04/18/2018 Elsevier Patient Education  2021 [employer]  Ondansetron oral dissolving tablet What is this medicine? ONDANSETRON (on DAN se tron) is used to treat nausea and vomiting caused by chemotherapy. It is also used to prevent or treat nausea and vomiting after  surgery. This medicine may be used for other purposes; ask your health care provider or pharmacist if you have questions. COMMON BRAND NAME(S): Zofran ODT What should I tell my health care provider before I take this medicine? They need to know if you have any of these conditions:  heart disease  history of irregular heartbeat  liver disease  low levels of magnesium or potassium in the blood  an unusual or allergic reaction to ondansetron, granisetron, other medicines, foods, dyes, or preservatives  pregnant or trying to get pregnant  breast-feeding How should I use this medicine? These tablets are made to dissolve in the mouth. Do not try to push the tablet through the foil backing. With dry hands, peel away the foil backing and gently remove the tablet. Place the tablet in the mouth and allow it to dissolve, then swallow. While you may take these tablets with water, it is not necessary to do so. Talk to your pediatrician regarding the use of this medicine in children. Special care may be needed. Overdosage: If you think you have taken too much of this medicine contact a poison control center or emergency room at once. NOTE: This medicine is only for you. Do not share this medicine with others. What if I miss a dose? If you miss a dose, take it as soon as you can. If it is almost time for your next dose, take only that dose. Do not take double or extra doses. What may interact with this medicine? Do not take this medicine with any of the following medications:  apomorphine  certain medicines for fungal infections like fluconazole, itraconazole, ketoconazole, posaconazole, voriconazole  cisapride  dronedarone  pimozide  thioridazine This medicine may also interact with the following medications:  carbamazepine  certain medicines for depression, anxiety, or psychotic disturbances  fentanyl  linezolid  MAOIs like Carbex, Eldepryl, Marplan, Nardil, and  Parnate  methylene blue (injected into a vein)  other medicines that prolong the QT interval (cause an abnormal heart rhythm) like dofetilide, ziprasidone  phenytoin  rifampicin  tramadol This list may not describe all possible interactions. Give your health care provider a list of all the medicines, herbs, non-prescription drugs, or dietary supplements you use. Also tell them if you smoke, drink alcohol, or use illegal drugs. Some items may interact with your medicine. What should I watch for while using this medicine? Check with your doctor or health care professional as soon as you can if you have any sign of an allergic reaction. What side effects may I notice from receiving this medicine? Side effects that you should report to your doctor or health care professional as soon as possible:  allergic reactions like skin rash, itching or hives, swelling of the face, lips, or tongue  breathing problems  confusion  dizziness  fast or irregular heartbeat  feeling faint or lightheaded, falls  fever and chills  loss of balance or coordination  seizures  sweating  swelling of the hands and  feet  tightness in the chest  tremors  unusually weak or tired Side effects that usually do not require medical attention (report to your doctor or health care professional if they continue or are bothersome):  constipation or diarrhea  headache This list may not describe all possible side effects. Call your doctor for medical advice about side effects. You may report side effects to FDA at 1-800-FDA-1088. Where should I keep my medicine? Keep out of the reach of children. Store between 2 and 30 degrees C (36 and 86 degrees F). Throw away any unused medicine after the expiration date. NOTE: This sheet is a summary. It may not cover all possible information. If you have questions about this medicine, talk to your doctor, pharmacist, or health care provider.  2021 Elsevier/Gold  Standard (2018-07-03 07:14:10)   Acetaminophen; Oxycodone tablets What is this medicine? ACETAMINOPHEN; OXYCODONE (a set a MEE noe fen; ox i KOE done) is a pain reliever. It is used to treat moderate to severe pain. This medicine may be used for other purposes; ask your health care provider or pharmacist if you have questions. COMMON BRAND NAME(S): Endocet, Magnacet, Nalocet, Narvox, Percocet, Perloxx, Primalev, Primlev, Prolate, Roxicet, Xolox What should I tell my health care provider before I take this medicine? They need to know if you have any of these conditions:  brain tumor  drug abuse or addiction  head injury  heart disease  if you often drink alcohol   kidney disease   liver disease  low adrenal gland function  lung disease, asthma, or breathing problem  seizures  stomach or intestine problems  taken an MAOI like Marplan, Nardil, or Parnate in the last 14 days  an unusual or allergic reaction to acetaminophen, oxycodone, other medicines, foods, dyes, or preservative  pregnant or trying to get pregnant  breast-feeding How should I use this medicine? Take this medicine by mouth with a full glass of water. Take it as directed on the label. You can take it with or without food. If it upsets your stomach, take it with food. Do not use it more often than directed. There may be unused or extra doses in the bottle after you finish your treatment. Talk to your health care provider if you have questions about your dose. A special MedGuide will be given to you by the pharmacist with each prescription and refill. Be sure to read this information carefully each time. Talk to your health care provider about the use of this medicine in children. Special care may be needed. Patients over 55 years of age may have a stronger reaction and need a smaller dose. Overdosage: If you think you have taken too much of this medicine contact a poison control center or emergency room at  once. NOTE: This medicine is only for you. Do not share this medicine with others. What if I miss a dose? This does not apply. This medicine is not for regular use. It should only be used as needed. What may interact with this medicine? This medicine may interact with the following medications:  alcohol  antihistamines for allergy, cough and cold  antiviral medicines for HIV or AIDS  atropine  certain antibiotics like clarithromycin, erythromycin, linezolid, rifampin  certain medicines for anxiety or sleep  certain medicines for bladder problems like oxybutynin, tolterodine  certain medicines for depression like amitriptyline, fluoxetine, sertraline  certain medicines for fungal infections like ketoconazole, itraconazole, voriconazole  certain medicines for migraine headache like almotriptan, eletriptan, frovatriptan, naratriptan, rizatriptan,  sumatriptan, zolmitriptan  certain medicines for nausea or vomiting like dolasetron, ondansetron, palonosetron  certain medicines for Parkinson's disease like benztropine, trihexyphenidyl  certain medicines for seizures like phenobarbital, phenytoin, primidone  certain medicines for stomach problems like dicyclomine, hyoscyamine  certain medicines for travel sickness like scopolamine  diuretics  general anesthetics like halothane, isoflurane, methoxyflurane, propofol  ipratropium  local anesthetics like lidocaine, pramoxine, tetracaine  MAOIs like Carbex, Eldepryl, Marplan, Nardil, and Parnate  medicines that relax muscles for surgery  methylene blue  nilotinib  other medicines with acetaminophen  other narcotic medicines for pain or cough  phenothiazines like chlorpromazine, mesoridazine, prochlorperazine, thioridazine This list may not describe all possible interactions. Give your health care provider a list of all the medicines, herbs, non-prescription drugs, or dietary supplements you use. Also tell them if you  smoke, drink alcohol, or use illegal drugs. Some items may interact with your medicine. What should I watch for while using this medicine? Tell your health care provider if your pain does not go away, if it gets worse, or if you have new or a different type of pain. You may develop tolerance to this drug. Tolerance means that you will need a higher dose of the drug for pain relief. Tolerance is normal and is expected if you take this drug for a long time. There are different types of narcotic drugs (opioids) for pain. If you take more than one type at the same time, you may have more side effects. Give your health care provider a list of all drugs you use. He or she will tell you how much drug to take. Do not take more drug than directed. Get emergency help right away if you have problems breathing. Do not suddenly stop taking your drug because you may develop a severe reaction. Your body becomes used to the drug. This does NOT mean you are addicted. Addiction is a behavior related to getting and using a drug for a nonmedical reason. If you have pain, you have a medical reason to take pain drug. Your health care provider will tell you how much drug to take. If your health care provider wants you to stop the drug, the dose will be slowly lowered over time to avoid any side effects. Talk to your health care provider about naloxone and how to get it. Naloxone is an emergency drug used for an opioid overdose. An overdose can happen if you take too much opioid. It can also happen if an opioid is taken with some other drugs or substances, like alcohol. Know the symptoms of an overdose, like trouble breathing, unusually tired or sleepy, or not being able to respond or wake up. Make sure to tell caregivers and close contacts where it is stored. Make sure they know how to use it. After naloxone is given, you must get emergency help right away. Naloxone is a temporary treatment. Repeat doses may be needed. Do not take  other drugs that contain acetaminophen with this drug. Many non-prescription drugs contain acetaminophen. Always read labels carefully. If you have questions, ask your health care provider. If you take too much acetaminophen, get medical help right away. Too much acetaminophen can be very dangerous and cause liver damage. Even if you do not have symptoms, it is important to get help right away. This drug does not prevent a heart attack or stroke. This drug may increase the chance of a heart attack or stroke. The chance may increase the longer you use this drug or  if you have heart disease. If you take aspirin to prevent a heart attack or stroke, talk to your health care provider about using this drug. You may get drowsy or dizzy. Do not drive, use machinery, or do anything that needs mental alertness until you know how this drug affects you. Do not stand up or sit up quickly, especially if you are an older patient. This reduces the risk of dizzy or fainting spells. Alcohol may interfere with the effect of this drug. Avoid alcoholic drinks. This drug will cause constipation. If you do not have a bowel movement for 3 days, call your health care provider. Your mouth may get dry. Chewing sugarless gum or sucking hard candy and drinking plenty of water may help. Contact your health care provider if the problem does not go away or is severe. What side effects may I notice from receiving this medicine? Side effects that you should report to your doctor or health care professional as soon as possible:  allergic reactions (skin rash, itching or hives; swelling of the face, lips, or tongue)  confusion  kidney injury (trouble passing urine or change in the amount of urine)  light-colored stool  liver injury (dark yellow or brown urine; general ill feeling or flu-like symptoms; loss of appetite, right upper belly pain; unusually weak or tired, yellowing of the eyes or skin)  low adrenal gland function  (nausea; vomiting; loss of appetite; unusually weak or tired; dizziness; low blood pressure)  low blood pressure (dizziness; feeling faint or lightheaded, falls; unusually weak or tired)  redness, blistering, peeling, or loosening of the skin, including inside the mouth  serotonin syndrome (irritable; confusion; diarrhea; fast or irregular heartbeat; muscle twitching; stiff muscles; trouble walking; sweating; high fever; seizures; chills; vomiting)  trouble breathing Side effects that usually do not require medical attention (report to your doctor or health care professional if they continue or are bothersome):  constipation  dry mouth  nausea, vomiting  tiredness This list may not describe all possible side effects. Call your doctor for medical advice about side effects. You may report side effects to FDA at 1-800-FDA-1088. Where should I keep my medicine? Keep out of the reach of children and pets. This medicine can be abused. Keep it in a safe place to protect it from theft. Do not share it with anyone. It is only for you. Selling or giving away this medicine is dangerous and against the law. Store at room temperature between 20 and 25 degrees C (68 and 77 degrees F). Protect from light. Get rid of any unused medicine after the expiration date. This medicine may cause harm and death if it is taken by other adults, children, or pets. It is important to get rid of the medicine as soon as you no longer need it or it is expired. You can do this in two ways:  Take the medicine to a medicine take-back program. Check with your pharmacy or law enforcement to find a location.  If you cannot return the medicine, flush it down the toilet. NOTE: This sheet is a summary. It may not cover all possible information. If you have questions about this medicine, talk to your doctor, pharmacist, or health care provider.  2021 Elsevier/Gold Standard (2020-04-08 11:12:15)   Tamsulosin capsules What is  this medicine? TAMSULOSIN (tam SOO loe sin) is an alpha blocker. It is used to treat the signs and symptoms of an enlarged prostate in men. This condition is also called benign prostatic hyperplasia (  BPH). This medicine may be used for other purposes; ask your health care provider or pharmacist if you have questions. COMMON BRAND NAME(S): Flomax What should I tell my health care provider before I take this medicine? They need to know if you have any of the following conditions:  advanced kidney disease  advanced liver disease  low blood pressure  prostate cancer  an unusual or allergic reaction to tamsulosin, sulfa drugs, other medicines, foods, dyes, or preservatives  pregnant or trying to get pregnant  breast-feeding How should I use this medicine? Take this medicine by mouth about 30 minutes after the same meal every day. Follow the directions on the prescription label. Swallow the capsules whole with a glass of water. Do not crush, chew, or open capsules. Do not take your medicine more often than directed. Do not stop taking your medicine unless your doctor tells you to. Talk to your pediatrician regarding the use of this medicine in children. Special care may be needed. Overdosage: If you think you have taken too much of this medicine contact a poison control center or emergency room at once. NOTE: This medicine is only for you. Do not share this medicine with others. What if I miss a dose? If you miss a dose, take it as soon as you can. If it is almost time for your next dose, take only that dose. Do not take double or extra doses. If you stop taking your medicine for several days or more, ask your doctor or health care professional what dose you should start back on. What may interact with this medicine?  cimetidine  fluoxetine  ketoconazole  medicines for erectile disfunction like sildenafil, tadalafil, vardenafil  medicines for high blood pressure  other alpha-blockers  like alfuzosin, doxazosin, phentolamine, phenoxybenzamine, prazosin, terazosin  warfarin This list may not describe all possible interactions. Give your health care provider a list of all the medicines, herbs, non-prescription drugs, or dietary supplements you use. Also tell them if you smoke, drink alcohol, or use illegal drugs. Some items may interact with your medicine. What should I watch for while using this medicine? Visit your doctor or health care professional for regular check ups. You will need lab work done before you start this medicine and regularly while you are taking it. Check your blood pressure as directed. Ask your health care professional what your blood pressure should be, and when you should contact him or her. This medicine may make you feel dizzy or lightheaded. This is more likely to happen after the first dose, after an increase in dose, or during hot weather or exercise. Drinking alcohol and taking some medicines can make this worse. Do not drive, use machinery, or do anything that needs mental alertness until you know how this medicine affects you. Do not sit or stand up quickly. If you begin to feel dizzy, sit down until you feel better. These effects can decrease once your body adjusts to the medicine. Contact your doctor or health care professional right away if you have an erection that lasts longer than 4 hours or if it becomes painful. This may be a sign of a serious problem and must be treated right away to prevent permanent damage. If you are thinking of having cataract surgery, tell your eye surgeon that you have taken this medicine. What side effects may I notice from receiving this medicine? Side effects that you should report to your doctor or health care professional as soon as possible:  allergic reactions  like skin rash or itching, hives, swelling of the lips, mouth, tongue, or throat  breathing problems  change in vision  feeling faint or  lightheaded  irregular heartbeat  prolonged or painful erection  weakness Side effects that usually do not require medical attention (report to your doctor or health care professional if they continue or are bothersome):  back pain  change in sex drive or performance  constipation, nausea or vomiting  cough  drowsy  runny or stuffy nose  trouble sleeping This list may not describe all possible side effects. Call your doctor for medical advice about side effects. You may report side effects to FDA at 1-800-FDA-1088. Where should I keep my medicine? Keep out of the reach of children. Store at room temperature between 15 and 30 degrees C (59 and 86 degrees F). Throw away any unused medicine after the expiration date. NOTE: This sheet is a summary. It may not cover all possible information. If you have questions about this medicine, talk to your doctor, pharmacist, or health care provider.  2021 Elsevier/Gold Standard (2017-12-14 12:54:06)    General Anesthesia, Adult, Care After This sheet gives you information about how to care for yourself after your procedure. Your health care provider may also give you more specific instructions. If you have problems or questions, contact your health care provider. What can I expect after the procedure? After the procedure, the following side effects are common:  Pain or discomfort at the IV site.  Nausea.  Vomiting.  Sore throat.  Trouble concentrating.  Feeling cold or chills.  Feeling weak or tired.  Sleepiness and fatigue.  Soreness and body aches. These side effects can affect parts of the body that were not involved in surgery. Follow these instructions at home: For the time period you were told by your health care provider:  Rest.  Do not participate in activities where you could fall or become injured.  Do not drive or use machinery.  Do not drink alcohol.  Do not take sleeping pills or medicines that cause  drowsiness.  Do not make important decisions or sign legal documents.  Do not take care of children on your own.   Eating and drinking  Follow any instructions from your health care provider about eating or drinking restrictions.  When you feel hungry, start by eating small amounts of foods that are soft and easy to digest (bland), such as toast. Gradually return to your regular diet.  Drink enough fluid to keep your urine pale yellow.  If you vomit, rehydrate by drinking water, juice, or clear broth. General instructions  If you have sleep apnea, surgery and certain medicines can increase your risk for breathing problems. Follow instructions from your health care provider about wearing your sleep device: ? Anytime you are sleeping, including during daytime naps. ? While taking prescription pain medicines, sleeping medicines, or medicines that make you drowsy.  Have a responsible adult stay with you for the time you are told. It is important to have someone help care for you until you are awake and alert.  Return to your normal activities as told by your health care provider. Ask your health care provider what activities are safe for you.  Take over-the-counter and prescription medicines only as told by your health care provider.  If you smoke, do not smoke without supervision.  Keep all follow-up visits as told by your health care provider. This is important. Contact a health care provider if:  You have nausea  or vomiting that does not get better with medicine.  You cannot eat or drink without vomiting.  You have pain that does not get better with medicine.  You are unable to pass urine.  You develop a skin rash.  You have a fever.  You have redness around your IV site that gets worse. Get help right away if:  You have difficulty breathing.  You have chest pain.  You have blood in your urine or stool, or you vomit blood. Summary  After the procedure, it is common  to have a sore throat or nausea. It is also common to feel tired.  Have a responsible adult stay with you for the time you are told. It is important to have someone help care for you until you are awake and alert.  When you feel hungry, start by eating small amounts of foods that are soft and easy to digest (bland), such as toast. Gradually return to your regular diet.  Drink enough fluid to keep your urine pale yellow.  Return to your normal activities as told by your health care provider. Ask your health care provider what activities are safe for you. This information is not intended to replace advice given to you by your health care provider. Make sure you discuss any questions you have with your health care provider. Document Revised: 03/26/2020 Document Reviewed: 10/24/2019 Elsevier Patient Education  2021 [employer].

## 2020-09-07 ENCOUNTER — Encounter (HOSPITAL_COMMUNITY): Payer: Self-pay | Admitting: Urology

## 2020-09-18 ENCOUNTER — Other Ambulatory Visit: Payer: Self-pay

## 2020-09-18 ENCOUNTER — Ambulatory Visit (HOSPITAL_COMMUNITY)
Admission: RE | Admit: 2020-09-18 | Discharge: 2020-09-18 | Disposition: A | Discharge status: Home / Self Care | Payer: Medicaid Other | Source: Ambulatory Visit | Attending: Urology | Admitting: Urology

## 2020-09-18 ENCOUNTER — Ambulatory Visit (INDEPENDENT_AMBULATORY_CARE_PROVIDER_SITE_OTHER): Payer: Medicaid Other | Admitting: Urology

## 2020-09-18 ENCOUNTER — Encounter: Payer: Self-pay | Admitting: Urology

## 2020-09-18 VITALS — BP 120/87 | HR 87 | Temp 98.1°F | Ht 67.0 in | Wt 280.0 lb

## 2020-09-18 DIAGNOSIS — N200 Calculus of kidney: Secondary | ICD-10-CM | POA: Diagnosis not present

## 2020-09-18 LAB — MICROSCOPIC EXAMINATION: Renal Epithel, UA: NONE SEEN /hpf

## 2020-09-18 LAB — URINALYSIS, ROUTINE W REFLEX MICROSCOPIC
Bilirubin, UA: NEGATIVE
Glucose, UA: NEGATIVE
Nitrite, UA: NEGATIVE
Specific Gravity, UA: >1.03 — ABNORMAL HIGH (ref 1.005–1.030)
Urobilinogen, Ur: 0.2 mg/dL (ref 0.2–1.0)
pH, UA: 5 (ref 5.0–7.5)

## 2020-09-18 NOTE — Progress Notes (Signed)

## 2020-09-18 NOTE — Progress Notes (Signed)
Patient arrived at office- will go have KUB and return to see MD after for post op visit.,

## 2020-09-22 NOTE — Progress Notes (Signed)
09/18/2020 9:30 AM   William Anthony [birth year] [id number]  Referring provider: No referring provider defined for this encounter.  Right renal calculi  HPI: William Anthony is a 49yo here for followup for nephrolithiasis. He underwent right ureteroscopic stone extaction 2 week ago. Stone was dusted at that time. He has passed multiple small fragments. No flank pain currently. He has intermittent dysuria and urinary frequency with the stent in place. KUB shows 1cm residual calculi.    PMH: No past medical history on file.  Surgical History: Past Surgical History:  Procedure Laterality Date  . CYSTOSCOPY WITH RETROGRADE PYELOGRAM, URETEROSCOPY AND STENT PLACEMENT Right 09/04/2020   Procedure: CYSTOSCOPY WITH RETROGRADE PYELOGRAM, URETEROSCOPY AND STENT PLACEMENT;  Surgeon: Malen Gauze, MD;  Location: [facility];  Service: Urology;  Laterality: Right;  . HOLMIUM LASER APPLICATION Right 09/04/2020   Procedure: HOLMIUM LASER APPLICATION;  Surgeon: Malen Gauze, MD;  Location: [facility];  Service: Urology;  Laterality: Right;  . TONSILLECTOMY      Home Medications:  Allergies as of 09/18/2020   No Known Allergies     Medication List       Accurate as of September 18, 2020 11:59 PM. If you have any questions, ask your nurse or doctor.        ondansetron 4 MG tablet Commonly known as: Zofran Take 1 tablet (4 mg total) by mouth daily as needed for nausea or vomiting.   oxyCODONE-acetaminophen 5-325 MG tablet Commonly known as: Percocet Take 1 tablet by mouth every 4 (four) hours as needed for severe pain.   tamsulosin 0.4 MG Caps capsule Commonly known as: Flomax Take 1 capsule (0.4 mg total) by mouth daily after supper.       Allergies: No Known Allergies  Family History: Family History  Problem Relation Age of Onset  . Heart attack Father     Social History:  reports that he has never smoked. He has never used smokeless tobacco. He reports previous alcohol  use. He reports previous drug use.  ROS: All other review of systems were reviewed and are negative except what is noted above in HPI  Physical Exam: BP 120/87   Pulse 87   Temp 98.1 F (36.7 C)   Ht 5\' 7"  (1.702 m)   Wt 280 lb (127 kg)   BMI 43.85 kg/m   Constitutional:  Alert and oriented, No acute distress. HEENT: [place] AT, moist mucus membranes.  Trachea midline, no masses. Cardiovascular: No clubbing, cyanosis, or edema. Respiratory: Normal respiratory effort, no increased work of breathing. GI: Abdomen is soft, nontender, nondistended, no abdominal masses GU: No CVA tenderness.  Lymph: No cervical or inguinal lymphadenopathy. Skin: No rashes, bruises or suspicious lesions. Neurologic: Grossly intact, no focal deficits, moving all 4 extremities. Psychiatric: Normal mood and affect.  Laboratory Data: No results found for: WBC, HGB, HCT, MCV, PLT  No results found for: CREATININE  No results found for: PSA  No results found for: TESTOSTERONE  No results found for: HGBA1C  Urinalysis    Component Value Date/Time   APPEARANCEUR Cloudy (A) 09/18/2020 1452   GLUCOSEU Negative 09/18/2020 1452   BILIRUBINUR Negative 09/18/2020 1452   PROTEINUR 2+ (A) 09/18/2020 1452   NITRITE Negative 09/18/2020 1452   LEUKOCYTESUR 1+ (A) 09/18/2020 1452    Lab Results  Component Value Date   LABMICR See below: 09/18/2020   WBCUA 6-10 (A) 09/18/2020   LABEPIT 0-10 09/18/2020   MUCUS Present 09/18/2020   BACTERIA Moderate (A)  09/18/2020    Pertinent Imaging: KUb today: Images reviewed and discussed with the patient Results for orders placed in visit on 07/21/20  Abdomen 1 view (KUB)  Narrative CLINICAL DATA:  Right nephrolithiasis  EXAM: ABDOMEN - 1 VIEW  COMPARISON:  None.  FINDINGS: Multiple calcifications overlying the right kidney, measuring up to 17 mm along the interpolar region.  Additional 16 mm calculus along the right renal  pelvis/collecting system.  Visualized osseous structures are within normal limits.  IMPRESSION: Multiple calcifications overlying the right kidney, measuring up to 17 mm along the interpolar region.  Additional 16 mm calculus along the right renal pelvis/collecting system.   Electronically Signed By: Charline Bills M.D. On: 07/21/2020 15:42  No results found for this or any previous visit.  No results found for this or any previous visit.  No results found for this or any previous visit.  No results found for this or any previous visit.  No results found for this or any previous visit.  No results found for this or any previous visit.  No results found for this or any previous visit.   Assessment & Plan:    1. Nephrolithiasis -We discussed the management of kidney stones. These options include observation, ureteroscopy, shockwave lithotripsy (ESWL) and percutaneous nephrolithotomy (PCNL). We discussed which options are relevant to the patient's stone(s). We discussed the natural history of kidney stones as well as the complications of untreated stones and the impact on quality of life without treatment as well as with each of the above listed treatments. We also discussed the efficacy of each treatment in its ability to clear the stone burden. With any of these management options I discussed the signs and symptoms of infection and the need for emergent treatment should these be experienced. For each option we discussed the ability of each procedure to clear the patient of their stone burden.   For observation I described the risks which include but are not limited to silent renal damage, life-threatening infection, need for emergent surgery, failure to pass stone and pain.   For ureteroscopy I described the risks which include bleeding, infection, damage to contiguous structures, positioning injury, ureteral stricture, ureteral avulsion, ureteral injury, need for prolonged  ureteral stent, inability to perform ureteroscopy, need for an interval procedure, inability to clear stone burden, stent discomfort/pain, heart attack, stroke, pulmonary embolus and the inherent risks with general anesthesia.   For shockwave lithotripsy I described the risks which include arrhythmia, kidney contusion, kidney hemorrhage, need for transfusion, pain, inability to adequately break up stone, inability to pass stone fragments, Steinstrasse, infection associated with obstructing stones, need for alternate surgical procedure, need for repeat shockwave lithotripsy, MI, CVA, PE and the inherent risks with anesthesia/conscious sedation.   For PCNL I described the risks including positioning injury, pneumothorax, hydrothorax, need for chest tube, inability to clear stone burden, renal laceration, arterial venous fistula or malformation, need for embolization of kidney, loss of kidney or renal function, need for repeat procedure, need for prolonged nephrostomy tube, ureteral avulsion, MI, CVA, PE and the inherent risks of general anesthesia.   - The patient would like to proceed with right ureteroscopic stone extraction  - DG Abd 1 View; Future - Urinalysis, Routine w reflex microscopic   No follow-ups on file.  Wilkie Aye, MD  [facility] [place]

## 2020-10-16 ENCOUNTER — Encounter (HOSPITAL_COMMUNITY)
Admission: RE | Admit: 2020-10-16 | Discharge: 2020-10-16 | Disposition: A | Discharge status: Home / Self Care | Payer: Self-pay | Source: Ambulatory Visit | Attending: Urology | Admitting: Urology

## 2020-10-16 ENCOUNTER — Encounter (HOSPITAL_COMMUNITY): Payer: Self-pay

## 2020-10-16 ENCOUNTER — Other Ambulatory Visit: Payer: Self-pay

## 2020-10-20 ENCOUNTER — Other Ambulatory Visit: Payer: Self-pay

## 2020-10-20 ENCOUNTER — Other Ambulatory Visit (HOSPITAL_COMMUNITY)
Admission: RE | Admit: 2020-10-20 | Discharge: 2020-10-20 | Disposition: A | Discharge status: Home / Self Care | Payer: Self-pay | Source: Ambulatory Visit | Attending: Urology | Admitting: Urology

## 2020-10-20 DIAGNOSIS — Z01812 Encounter for preprocedural laboratory examination: Secondary | ICD-10-CM | POA: Insufficient documentation

## 2020-10-20 DIAGNOSIS — Z20822 Contact with and (suspected) exposure to covid-19: Secondary | ICD-10-CM | POA: Insufficient documentation

## 2020-10-20 LAB — SARS CORONAVIRUS 2 (TAT 6-24 HRS): SARS Coronavirus 2: NEGATIVE

## 2020-10-22 ENCOUNTER — Ambulatory Visit (HOSPITAL_COMMUNITY): Payer: Self-pay | Admitting: Anesthesiology

## 2020-10-22 ENCOUNTER — Encounter (HOSPITAL_COMMUNITY): Payer: Self-pay | Admitting: Urology

## 2020-10-22 ENCOUNTER — Encounter (HOSPITAL_COMMUNITY)
Admission: RE | Disposition: A | Discharge status: Home / Self Care | Payer: Self-pay | Source: Home / Self Care | Attending: Urology

## 2020-10-22 ENCOUNTER — Other Ambulatory Visit: Payer: Self-pay

## 2020-10-22 ENCOUNTER — Ambulatory Visit (HOSPITAL_COMMUNITY)
Admission: RE | Admit: 2020-10-22 | Discharge: 2020-10-22 | Disposition: A | Discharge status: Home / Self Care | Payer: Self-pay | Attending: Urology | Admitting: Urology

## 2020-10-22 ENCOUNTER — Ambulatory Visit (HOSPITAL_COMMUNITY): Payer: Self-pay

## 2020-10-22 DIAGNOSIS — N200 Calculus of kidney: Secondary | ICD-10-CM | POA: Insufficient documentation

## 2020-10-22 HISTORY — PX: HOLMIUM LASER APPLICATION: SHX5852

## 2020-10-22 HISTORY — PX: CYSTOSCOPY WITH RETROGRADE PYELOGRAM, URETEROSCOPY AND STENT PLACEMENT: SHX5789

## 2020-10-22 SURGERY — CYSTOURETEROSCOPY, WITH RETROGRADE PYELOGRAM AND STENT INSERTION
Anesthesia: General | Laterality: Right

## 2020-10-22 MED ORDER — MIDAZOLAM HCL 5 MG/5ML IJ SOLN
INTRAMUSCULAR | Status: DC | PRN
  Administered 2020-10-22: 2 mg via INTRAVENOUS

## 2020-10-22 MED ORDER — ORAL CARE MOUTH RINSE: 15.0000 mL | Freq: Once | OROMUCOSAL | Status: DC

## 2020-10-22 MED ORDER — MEPERIDINE HCL 50 MG/ML IJ SOLN: 6.2500 mg | INTRAMUSCULAR | Status: DC | PRN

## 2020-10-22 MED ORDER — ONDANSETRON HCL 4 MG/2ML IJ SOLN
INTRAMUSCULAR | Status: DC | PRN
  Administered 2020-10-22: 4 mg via INTRAVENOUS

## 2020-10-22 MED ORDER — LIDOCAINE HCL (PF) 2 % IJ SOLN
INTRAMUSCULAR | Status: AC
  Filled 2020-10-22: qty 5

## 2020-10-22 MED ORDER — CEFAZOLIN SODIUM-DEXTROSE 1-4 GM/50ML-% IV SOLN
1.0000 g | INTRAVENOUS | Status: AC
  Administered 2020-10-22: 1 g via INTRAVENOUS

## 2020-10-22 MED ORDER — CHLORHEXIDINE GLUCONATE 0.12 % MT SOLN: 15.0000 mL | Freq: Once | OROMUCOSAL | Status: DC

## 2020-10-22 MED ORDER — FENTANYL CITRATE (PF) 100 MCG/2ML IJ SOLN
INTRAMUSCULAR | Status: DC | PRN
  Administered 2020-10-22: 100 ug via INTRAVENOUS
  Administered 2020-10-22: 50 ug via INTRAVENOUS

## 2020-10-22 MED ORDER — DEXAMETHASONE SODIUM PHOSPHATE 10 MG/ML IJ SOLN
INTRAMUSCULAR | Status: DC | PRN
  Administered 2020-10-22: 10 mg via INTRAVENOUS

## 2020-10-22 MED ORDER — ONDANSETRON HCL 4 MG/2ML IJ SOLN: 4.0000 mg | Freq: Once | INTRAMUSCULAR | Status: DC | PRN

## 2020-10-22 MED ORDER — FENTANYL CITRATE (PF) 100 MCG/2ML IJ SOLN
INTRAMUSCULAR | Status: AC
  Filled 2020-10-22: qty 2

## 2020-10-22 MED ORDER — LACTATED RINGERS IV SOLN: INTRAVENOUS | Status: DC

## 2020-10-22 MED ORDER — MIDAZOLAM HCL 2 MG/2ML IJ SOLN
INTRAMUSCULAR | Status: AC
  Filled 2020-10-22: qty 2

## 2020-10-22 MED ORDER — TAMSULOSIN HCL 0.4 MG PO CAPS: 0.4000 mg | ORAL_CAPSULE | Freq: Every day | ORAL | 1 refills | Status: DC

## 2020-10-22 MED ORDER — DEXAMETHASONE SODIUM PHOSPHATE 10 MG/ML IJ SOLN
INTRAMUSCULAR | Status: AC
  Filled 2020-10-22: qty 1

## 2020-10-22 MED ORDER — DIATRIZOATE MEGLUMINE 30 % UR SOLN
URETHRAL | Status: AC
  Filled 2020-10-22: qty 100

## 2020-10-22 MED ORDER — SODIUM CHLORIDE 0.9 % IR SOLN
Status: DC | PRN
  Administered 2020-10-22: 3000 mL via INTRAVESICAL
  Administered 2020-10-22: 3000 mL

## 2020-10-22 MED ORDER — DIATRIZOATE MEGLUMINE 30 % UR SOLN
URETHRAL | Status: DC | PRN
  Administered 2020-10-22: 10 mL

## 2020-10-22 MED ORDER — CEFAZOLIN SODIUM-DEXTROSE 2-4 GM/100ML-% IV SOLN
INTRAVENOUS | Status: AC
  Filled 2020-10-22: qty 100

## 2020-10-22 MED ORDER — SUCCINYLCHOLINE CHLORIDE 200 MG/10ML IV SOSY
PREFILLED_SYRINGE | INTRAVENOUS | Status: AC
  Filled 2020-10-22: qty 10

## 2020-10-22 MED ORDER — ONDANSETRON HCL 4 MG/2ML IJ SOLN
INTRAMUSCULAR | Status: AC
  Filled 2020-10-22: qty 2

## 2020-10-22 MED ORDER — CEFAZOLIN SODIUM-DEXTROSE 1-4 GM/50ML-% IV SOLN
INTRAVENOUS | Status: AC
  Filled 2020-10-22: qty 50

## 2020-10-22 MED ORDER — LIDOCAINE HCL (CARDIAC) PF 100 MG/5ML IV SOSY
PREFILLED_SYRINGE | INTRAVENOUS | Status: DC | PRN
  Administered 2020-10-22: 100 mg via INTRATRACHEAL

## 2020-10-22 MED ORDER — HYDROMORPHONE HCL 1 MG/ML IJ SOLN: 0.2500 mg | INTRAMUSCULAR | Status: DC | PRN

## 2020-10-22 MED ORDER — CEFAZOLIN SODIUM-DEXTROSE 2-4 GM/100ML-% IV SOLN: 2.0000 g | INTRAVENOUS | Status: DC

## 2020-10-22 MED ORDER — OXYCODONE-ACETAMINOPHEN 5-325 MG PO TABS: 1.0000 | ORAL_TABLET | ORAL | 0 refills | Status: AC | PRN

## 2020-10-22 MED ORDER — PROPOFOL 10 MG/ML IV BOLUS
INTRAVENOUS | Status: DC | PRN
  Administered 2020-10-22: 250 mg via INTRAVENOUS

## 2020-10-22 MED ORDER — ONDANSETRON HCL 4 MG PO TABS: 4.0000 mg | ORAL_TABLET | Freq: Every day | ORAL | 1 refills | Status: AC | PRN

## 2020-10-22 MED ORDER — WATER FOR IRRIGATION, STERILE IR SOLN
Status: DC | PRN
  Administered 2020-10-22: 500 mL

## 2020-10-22 MED ORDER — CEFAZOLIN SODIUM-DEXTROSE 2-4 GM/100ML-% IV SOLN
2.0000 g | INTRAVENOUS | Status: AC
  Administered 2020-10-22: 2 g via INTRAVENOUS

## 2020-10-22 SURGICAL SUPPLY — 26 items
BAG DRAIN URO TABLE W/ADPT NS (BAG) ×2 IMPLANT
BAG DRN 8 ADPR NS SKTRN CSTL (BAG) ×1
BAG HAMPER (MISCELLANEOUS) ×2 IMPLANT
CATH INTERMIT  6FR 70CM (CATHETERS) ×2 IMPLANT
CLOTH BEACON ORANGE TIMEOUT ST (SAFETY) ×2 IMPLANT
DECANTER SPIKE VIAL GLASS SM (MISCELLANEOUS) ×2 IMPLANT
EXTRACTOR STONE NITINOL NGAGE (UROLOGICAL SUPPLIES) ×2 IMPLANT
GLOVE BIO SURGEON STRL SZ8 (GLOVE) ×2 IMPLANT
GLOVE SURG UNDER POLY LF SZ7 (GLOVE) ×4 IMPLANT
GOWN STRL REUS W/TWL LRG LVL3 (GOWN DISPOSABLE) ×2 IMPLANT
GOWN STRL REUS W/TWL XL LVL3 (GOWN DISPOSABLE) ×2 IMPLANT
GUIDEWIRE STR DUAL SENSOR (WIRE) ×2 IMPLANT
GUIDEWIRE STR ZIPWIRE 035X150 (MISCELLANEOUS) ×2 IMPLANT
IV NS IRRIG 3000ML ARTHROMATIC (IV SOLUTION) ×4 IMPLANT
KIT TURNOVER CYSTO (KITS) ×2 IMPLANT
MANIFOLD NEPTUNE II (INSTRUMENTS) ×2 IMPLANT
PACK CYSTO (CUSTOM PROCEDURE TRAY) ×2 IMPLANT
PAD ARMBOARD 7.5X6 YLW CONV (MISCELLANEOUS) ×2 IMPLANT
SHEATH URETERAL 12FRX35CM (MISCELLANEOUS) ×2 IMPLANT
STENT URET 6FRX26 CONTOUR (STENTS) ×2 IMPLANT
SYR 10ML LL (SYRINGE) ×2 IMPLANT
SYR CONTROL 10ML LL (SYRINGE) ×2 IMPLANT
TOWEL OR 17X26 4PK STRL BLUE (TOWEL DISPOSABLE) ×2 IMPLANT
TRACTIP FLEXIVA PULS ID 200XHI (Laser) ×1 IMPLANT
TRACTIP FLEXIVA PULSE ID 200 (Laser) ×2
WATER STERILE IRR 500ML POUR (IV SOLUTION) ×2 IMPLANT

## 2020-10-22 NOTE — Transfer of Care (Signed)
Immediate Anesthesia Transfer of Care Note  Patient: William Anthony  Procedure(s) Performed: CYSTOSCOPY WITH RIGHT RETROGRADE PYELOGRAM, RIGHT URETEROSCOPY AND RIGHT STENT EXCHANGE (Right ) HOLMIUM LASER APPLICATION RIGHT RENAL CALCULI (Right )  Patient Location: PACU  Anesthesia Type:General  Level of Consciousness: awake  Airway & Oxygen Therapy: Patient Spontanous Breathing and Patient connected to face mask oxygen  Post-op Assessment: Report given to RN and Post -op Vital signs reviewed and stable  Post vital signs: Reviewed and stable  Last Vitals:  Vitals Value Taken Time  BP 145/92 10/22/20 1104  Temp 36.4 C 10/22/20 1104  Pulse 86 10/22/20 1112  Resp 15 10/22/20 1112  SpO2 98 % 10/22/20 1112  Vitals shown include unvalidated device data.  Last Pain:  Vitals:   10/22/20 1104  TempSrc:   PainSc: 0-No pain      Patients Stated Pain Goal: 5 (10/22/20 0725)  Complications: No complications documented.

## 2020-10-22 NOTE — Anesthesia Postprocedure Evaluation (Signed)
Anesthesia Post Note  Patient: William Anthony  Procedure(s) Performed: CYSTOSCOPY WITH RIGHT RETROGRADE PYELOGRAM, RIGHT URETEROSCOPY AND RIGHT STENT EXCHANGE (Right ) HOLMIUM LASER APPLICATION RIGHT RENAL CALCULI (Right )  Patient location during evaluation: PACU Anesthesia Type: General Level of consciousness: awake Pain management: pain level controlled Vital Signs Assessment: post-procedure vital signs reviewed and stable Respiratory status: spontaneous breathing, nonlabored ventilation and respiratory function stable Cardiovascular status: stable Postop Assessment: no apparent nausea or vomiting Anesthetic complications: no   No complications documented.   Last Vitals:  Vitals:   10/22/20 0725 10/22/20 1104  BP: (!) 136/91 (!) 145/92  Pulse: 87   Resp: (!) 21   Temp: 36.9 C 36.4 C    Last Pain:  Vitals:   10/22/20 1104  TempSrc:   PainSc: 0-No pain                 Betzabe Bevans Hristova

## 2020-10-22 NOTE — Anesthesia Procedure Notes (Signed)
Procedure Name: LMA Insertion Date/Time: 10/22/2020 9:30 AM Performed by: Elgie Congo, CRNA Pre-anesthesia Checklist: Patient identified, Emergency Drugs available, Suction available and Patient being monitored Patient Re-evaluated:Patient Re-evaluated prior to induction Oxygen Delivery Method: Circle system utilized Preoxygenation: Pre-oxygenation with 100% oxygen Induction Type: IV induction Ventilation: Mask ventilation without difficulty LMA: LMA inserted LMA Size: 5.0 Number of attempts: 1 Airway Equipment and Method: Oral airway Placement Confirmation: positive ETCO2 and breath sounds checked- equal and bilateral Tube secured with: Tape Dental Injury: Teeth and Oropharynx as per pre-operative assessment

## 2020-10-22 NOTE — Discharge Instructions (Signed)
Ureteral Stent Implantation, Care After This sheet gives you information about how to care for yourself after your procedure. Your health care provider may also give you more specific instructions. If you have problems or questions, contact your health care provider. What can I expect after the procedure? After the procedure, it is common to have:  Nausea.  Mild pain when you urinate. You may feel this pain in your lower back or lower abdomen. The pain should stop within a few minutes after you urinate. This may last for up to 1 week.  A small amount of blood in your urine for several days. Follow these instructions at home: Medicines  Take over-the-counter and prescription medicines only as told by your health care provider.  If you were prescribed an antibiotic medicine, take it as told by your health care provider. Do not stop taking the antibiotic even if you start to feel better.  Do not drive for 24 hours if you were given a sedative during your procedure.  Ask your health care provider if the medicine prescribed to you requires you to avoid driving or using heavy machinery. Activity  Rest as told by your health care provider.  Avoid sitting for a long time without moving. Get up to take short walks every 1-2 hours. This is important to improve blood flow and breathing. Ask for help if you feel weak or unsteady.  Return to your normal activities as told by your health care provider. Ask your health care provider what activities are safe for you. General instructions  Watch for any blood in your urine. Call your health care provider if the amount of blood in your urine increases.  If you have a catheter: ? Follow instructions from your health care provider about taking care of your catheter and collection bag. ? Do not take baths, swim, or use a hot tub until your health care provider approves. Ask your health care provider if you may take showers. You may only be allowed to  take sponge baths.  Drink enough fluid to keep your urine pale yellow.  Do not use any products that contain nicotine or tobacco, such as cigarettes, e-cigarettes, and chewing tobacco. These can delay healing after surgery. If you need help quitting, ask your health care provider.  Keep all follow-up visits as told by your health care provider. This is important.   Contact a health care provider if:  You have pain that gets worse or does not get better with medicine, especially pain when you urinate.  You have difficulty urinating.  You feel nauseous or you vomit repeatedly during a period of more than 2 days after the procedure. Get help right away if:  Your urine is dark red or has blood clots in it.  You are leaking urine (have incontinence).  The end of the stent comes out of your urethra.  You cannot urinate.  You have sudden, sharp, or severe pain in your abdomen or lower back.  You have a fever.  You have swelling or pain in your legs.  You have difficulty breathing. Summary  After the procedure, it is common to have mild pain when you urinate that goes away within a few minutes after you urinate. This may last for up to 1 week.  Watch for any blood in your urine. Call your health care provider if the amount of blood in your urine increases.  Take over-the-counter and prescription medicines only as told by your health care provider.    Drink enough fluid to keep your urine pale yellow. This information is not intended to replace advice given to you by your health care provider. Make sure you discuss any questions you have with your health care provider. Document Revised: 04/17/2018 Document Reviewed: 04/18/2018 Elsevier Patient Education  2021 [employer]    PLEASE REMOVE YOUR STENT IN 72 HOURS BY GENTLY PULLING THE STRING     General Anesthesia, Adult, Care After This sheet gives you information about how to care for yourself after your procedure. Your  health care provider may also give you more specific instructions. If you have problems or questions, contact your health care provider. What can I expect after the procedure? After the procedure, the following side effects are common:  Pain or discomfort at the IV site.  Nausea.  Vomiting.  Sore throat.  Trouble concentrating.  Feeling cold or chills.  Feeling weak or tired.  Sleepiness and fatigue.  Soreness and body aches. These side effects can affect parts of the body that were not involved in surgery. Follow these instructions at home: For the time period you were told by your health care provider:  Rest.  Do not participate in activities where you could fall or become injured.  Do not drive or use machinery.  Do not drink alcohol.  Do not take sleeping pills or medicines that cause drowsiness.  Do not make important decisions or sign legal documents.  Do not take care of children on your own.   Eating and drinking  Follow any instructions from your health care provider about eating or drinking restrictions.  When you feel hungry, start by eating small amounts of foods that are soft and easy to digest (bland), such as toast. Gradually return to your regular diet.  Drink enough fluid to keep your urine pale yellow.  If you vomit, rehydrate by drinking water, juice, or clear broth. General instructions  If you have sleep apnea, surgery and certain medicines can increase your risk for breathing problems. Follow instructions from your health care provider about wearing your sleep device: ? Anytime you are sleeping, including during daytime naps. ? While taking prescription pain medicines, sleeping medicines, or medicines that make you drowsy.  Have a responsible adult stay with you for the time you are told. It is important to have someone help care for you until you are awake and alert.  Return to your normal activities as told by your health care provider.  Ask your health care provider what activities are safe for you.  Take over-the-counter and prescription medicines only as told by your health care provider.  If you smoke, do not smoke without supervision.  Keep all follow-up visits as told by your health care provider. This is important. Contact a health care provider if:  You have nausea or vomiting that does not get better with medicine.  You cannot eat or drink without vomiting.  You have pain that does not get better with medicine.  You are unable to pass urine.  You develop a skin rash.  You have a fever.  You have redness around your IV site that gets worse. Get help right away if:  You have difficulty breathing.  You have chest pain.  You have blood in your urine or stool, or you vomit blood. Summary  After the procedure, it is common to have a sore throat or nausea. It is also common to feel tired.  Have a responsible adult stay with you for the time   you are told. It is important to have someone help care for you until you are awake and alert.  When you feel hungry, start by eating small amounts of foods that are soft and easy to digest (bland), such as toast. Gradually return to your regular diet.  Drink enough fluid to keep your urine pale yellow.  Return to your normal activities as told by your health care provider. Ask your health care provider what activities are safe for you. This information is not intended to replace advice given to you by your health care provider. Make sure you discuss any questions you have with your health care provider. Document Revised: 03/26/2020 Document Reviewed: 10/24/2019 Elsevier Patient Education  2021 [employer] Inc.  

## 2020-10-22 NOTE — Op Note (Signed)
.  Preoperative diagnosis: renal stone stone  Postoperative diagnosis: Same  Procedure: 1 cystoscopy 2. right retrograde pyelography 3.  Intraoperative fluoroscopy, under one hour, with interpretation 4.  Right ureteroscopic stone manipulation with laser lithotripsy 5.  Right 6 x 26 JJ stent placement  Attending: Cleda Mccreedy  Anesthesia: General  Estimated blood loss: None  Drains: Right 6 x 26 JJ ureteral stent with tether  Specimens: stone for analysis  Antibiotics: ancef  Findings: right lower and mid pole stones. Dense calcifications 1cm in mid pole are outside the collecting system. No hydronephrosis. No masses/lesions in the bladder. Ureteral orifices in normal anatomic location.  Indications: Patient is a 50 year old male with a history of right renal stone and who has persistent right flank pain.  After discussing treatment options, he decided proceed with right ureteroscopic stone manipulation.  Procedure her in detail: The patient was brought to the operating room and a brief timeout was done to ensure correct patient, correct procedure, correct site.  General anesthesia was administered patient was placed in dorsal lithotomy position.  his genitalia was then prepped and draped in usual sterile fashion.  A rigid 22 French cystoscope was passed in the urethra and the bladder.  Bladder was inspected free masses or lesions.  the ureteral orifices were in the normal orthotopic locations. Using a grasper the right ureteral stent was brought to the urethral meatus. A zpwire was advanced through the stent and up to the rena pelvis. The stetn was then removed. a 6 french ureteral catheter was then instilled into the right ureteral orifice.  a gentle retrograde was obtained and findings noted above.    we then removed the cystoscope and cannulated the left ureteral orifice with a semirigid ureteroscope.  No stone was found in the ureter. Once we reached the UPJ a sensor wire was  advanced in to the renal pelvis. We then removed the ureteroscope and advanced am 12/14 x 35cm access sheath up to the renal pelvis. We then used the flexible ureteroscope to perform nephroscopy. We encountered the stones in the lower and mid pole.  Using a 242nm laser fiber the stones were fragmented and the large fragments were then removed with a Ngage basket.    once all stone fragments were removed we then removed the access sheath under direct vision and noted no injury to the ureter. We then placed a 6 x 26 double-j ureteral stent over the original zip wire.  We then removed the wire and good coil was noted in the the renal pelvis under fluoroscopy and the bladder under direct vision. the bladder was then drained and this concluded the procedure which was well tolerated by patient.  Complications: None  Condition: Stable, extubated, transferred to PACU  Plan: Patient is to be discharged home as to follow-up in one week. He is to remove his stent in 72 hours by pulling the tether

## 2020-10-22 NOTE — Anesthesia Preprocedure Evaluation (Addendum)
Anesthesia Evaluation  Patient identified by MRN, date of birth, ID band Patient awake    Reviewed: Allergy & Precautions, NPO status , Patient's Chart, lab work & pertinent test results  History of Anesthesia Complications Negative for: history of anesthetic complications  Airway Mallampati: III  TM Distance: >3 FB Neck ROM: Full    Dental  (+) Dental Advisory Given, Teeth Intact   Pulmonary neg pulmonary ROS,    Pulmonary exam normal breath sounds clear to auscultation       Cardiovascular Exercise Tolerance: Good Normal cardiovascular exam Rhythm:Regular Rate:Normal     Neuro/Psych negative neurological ROS  negative psych ROS   GI/Hepatic negative GI ROS, Neg liver ROS,   Endo/Other  negative endocrine ROS  Renal/GU Renal disease (stones)     Musculoskeletal negative musculoskeletal ROS (+)   Abdominal   Peds  Hematology negative hematology ROS (+)   Anesthesia Other Findings   Reproductive/Obstetrics negative OB ROS                             Anesthesia Physical Anesthesia Plan  ASA: II  Anesthesia Plan: General   Post-op Pain Management:    Induction: Intravenous  PONV Risk Score and Plan: 4 or greater and Ondansetron, Dexamethasone and Midazolam  Airway Management Planned: LMA  Additional Equipment:   Intra-op Plan:   Post-operative Plan: Extubation in OR  Informed Consent: I have reviewed the patients History and Physical, chart, labs and discussed the procedure including the risks, benefits and alternatives for the proposed anesthesia with the patient or authorized representative who has indicated his/her understanding and acceptance.     Dental advisory given  Plan Discussed with: CRNA and Surgeon  Anesthesia Plan Comments:         Anesthesia Quick Evaluation

## 2020-10-22 NOTE — H&P (Signed)
Urology Admission H&P  Chief Complaint: rigth renal calculi  History of Present Illness: [name] is a 49yo here for right ureteroscopic stone extraction. He has residual 1cm right renal calculus  History reviewed. No pertinent past medical history. Past Surgical History:  Procedure Laterality Date  . CYSTOSCOPY WITH RETROGRADE PYELOGRAM, URETEROSCOPY AND STENT PLACEMENT Right 09/04/2020   Procedure: CYSTOSCOPY WITH RETROGRADE PYELOGRAM, URETEROSCOPY AND STENT PLACEMENT;  Surgeon: Malen Gauze, MD;  Location: [facility];  Service: Urology;  Laterality: Right;  . HOLMIUM LASER APPLICATION Right 09/04/2020   Procedure: HOLMIUM LASER APPLICATION;  Surgeon: Malen Gauze, MD;  Location: [facility];  Service: Urology;  Laterality: Right;  . TONSILLECTOMY      Home Medications:  Current Facility-Administered Medications  Medication Dose Route Frequency Provider Last Rate Last Admin  . ceFAZolin (ANCEF) 2-4 GM/100ML-% IVPB           . ceFAZolin (ANCEF) IVPB 1 g/50 mL premix  1 g Intravenous 30 min Pre-Op Ashlin Kreps, Mardene Celeste, MD       And  . ceFAZolin (ANCEF) IVPB 2g/100 mL premix  2 g Intravenous 30 min Pre-Op Kateryn Marasigan, Mardene Celeste, MD      . chlorhexidine (PERIDEX) 0.12 % solution 15 mL  15 mL Mouth/Throat Once Molli Barrows, MD       Or  . MEDLINE mouth rinse  15 mL Mouth Rinse Once Battula, Rajamani C, MD      . lactated ringers infusion   Intravenous Continuous Battula, Rajamani C, MD       Allergies: No Known Allergies  Family History  Problem Relation Age of Onset  . Heart attack Father    Social History:  reports that he has never smoked. He has never used smokeless tobacco. He reports previous alcohol use. He reports previous drug use.  Review of Systems  All other systems reviewed and are negative.   Physical Exam:  Vital signs in last 24 hours: Temp:  [98.4 F (36.9 C)] 98.4 F (36.9 C) (03/31 0725) Pulse Rate:  [87] 87 (03/31 0725) Resp:  [21] 21 (03/31  0725) BP: (136)/(91) 136/91 (03/31 0725) Physical Exam Vitals reviewed.  Constitutional:      Appearance: Normal appearance.  HENT:     Head: Normocephalic and atraumatic.     Nose: Nose normal.     Mouth/Throat:     Mouth: Mucous membranes are dry.  Eyes:     Extraocular Movements: Extraocular movements intact.     Pupils: Pupils are equal, round, and reactive to light.  Cardiovascular:     Rate and Rhythm: Normal rate and regular rhythm.  Pulmonary:     Effort: Pulmonary effort is normal. No respiratory distress.  Abdominal:     General: Abdomen is flat. There is no distension.  Musculoskeletal:        General: No swelling. Normal range of motion.     Cervical back: Normal range of motion and neck supple.  Skin:    General: Skin is warm and dry.  Neurological:     General: No focal deficit present.     Mental Status: He is alert and oriented to person, place, and time.  Psychiatric:        Mood and Affect: Mood normal.        Behavior: Behavior normal.        Thought Content: Thought content normal.        Judgment: Judgment normal.     Laboratory Data:  No  results found for this or any previous visit (from the past 24 hour(s)). Recent Results (from the past 240 hour(s))  SARS CORONAVIRUS 2 (TAT 6-24 HRS) Nasopharyngeal Nasopharyngeal Swab     Status: None   Collection Time: 10/20/20 12:00 PM   Specimen: Nasopharyngeal Swab  Result Value Ref Range Status   SARS Coronavirus 2 NEGATIVE NEGATIVE Final    Comment: (NOTE) SARS-CoV-2 target nucleic acids are NOT DETECTED.  The SARS-CoV-2 RNA is generally detectable in upper and lower respiratory specimens during the acute phase of infection. Negative results do not preclude SARS-CoV-2 infection, do not rule out co-infections with other pathogens, and should not be used as the sole basis for treatment or other patient management decisions. Negative results must be combined with clinical observations, patient history,  and epidemiological information. The expected result is Negative.  Fact Sheet for Patients: HairSlick.no  Fact Sheet for Healthcare Providers: quierodirigir.com  This test is not yet approved or cleared by the [place] FDA and  has been authorized for detection and/or diagnosis of SARS-CoV-2 by FDA under an Emergency Use Authorization (EUA). This EUA will remain  in effect (meaning this test can be used) for the duration of the COVID-19 declaration under Se ction 564(b)(1) of the Act, 21 U.S.C. section 360bbb-3(b)(1), unless the authorization is terminated or revoked sooner.  Performed at [facility], [address], [place], [place] [address]    Creatinine: No results for input(s): CREATININE in the last 168 hours. Baseline Creatinine: unknown  Impression/Assessment:  49yo with a right renal calculus  Plan:  -We discussed the management of kidney stones. These options include observation, ureteroscopy, shockwave lithotripsy (ESWL) and percutaneous nephrolithotomy (PCNL). We discussed which options are relevant to the patient's stone(s). We discussed the natural history of kidney stones as well as the complications of untreated stones and the impact on quality of life without treatment as well as with each of the above listed treatments. We also discussed the efficacy of each treatment in its ability to clear the stone burden. With any of these management options I discussed the signs and symptoms of infection and the need for emergent treatment should these be experienced. For each option we discussed the ability of each procedure to clear the patient of their stone burden.   For observation I described the risks which include but are not limited to silent renal damage, life-threatening infection, need for emergent surgery, failure to pass stone and pain.   For ureteroscopy I described the risks which include bleeding,  infection, damage to contiguous structures, positioning injury, ureteral stricture, ureteral avulsion, ureteral injury, need for prolonged ureteral stent, inability to perform ureteroscopy, need for an interval procedure, inability to clear stone burden, stent discomfort/pain, heart attack, stroke, pulmonary embolus and the inherent risks with general anesthesia.   For shockwave lithotripsy I described the risks which include arrhythmia, kidney contusion, kidney hemorrhage, need for transfusion, pain, inability to adequately break up stone, inability to pass stone fragments, Steinstrasse, infection associated with obstructing stones, need for alternate surgical procedure, need for repeat shockwave lithotripsy, MI, CVA, PE and the inherent risks with anesthesia/conscious sedation.   For PCNL I described the risks including positioning injury, pneumothorax, hydrothorax, need for chest tube, inability to clear stone burden, renal laceration, arterial venous fistula or malformation, need for embolization of kidney, loss of kidney or renal function, need for repeat procedure, need for prolonged nephrostomy tube, ureteral avulsion, MI, CVA, PE and the inherent risks of general anesthesia.   -  The patient would like to proceed with right ureteroscopic stone extraction  William Anthony 10/22/2020, 8:43 AM

## 2020-10-23 ENCOUNTER — Encounter (HOSPITAL_COMMUNITY): Payer: Self-pay | Admitting: Urology

## 2020-10-26 ENCOUNTER — Ambulatory Visit: Admit: 2020-10-26 | Payer: Medicaid Other | Admitting: Urology

## 2020-10-26 SURGERY — CYSTOURETEROSCOPY, WITH RETROGRADE PYELOGRAM AND STENT INSERTION
Anesthesia: General | Laterality: Right

## 2020-10-27 LAB — CALCULI, WITH PHOTOGRAPH (CLINICAL LAB)
Calcium Oxalate Dihydrate: 30 %
Calcium Oxalate Monohydrate: 70 %
Weight Calculi: 77 mg

## 2020-10-28 ENCOUNTER — Other Ambulatory Visit: Payer: Self-pay

## 2020-10-28 ENCOUNTER — Encounter: Payer: Self-pay | Admitting: Urology

## 2020-10-28 ENCOUNTER — Ambulatory Visit (INDEPENDENT_AMBULATORY_CARE_PROVIDER_SITE_OTHER): Payer: Medicaid Other | Admitting: Urology

## 2020-10-28 VITALS — BP 124/90 | HR 130 | Temp 98.7°F | Ht 67.75 in | Wt 274.5 lb

## 2020-10-28 DIAGNOSIS — N200 Calculus of kidney: Secondary | ICD-10-CM | POA: Diagnosis not present

## 2020-10-28 LAB — MICROSCOPIC EXAMINATION
Bacteria, UA: NONE SEEN
Epithelial Cells (non renal): NONE SEEN /hpf (ref 0–10)
RBC, Urine: >30 /hpf — AB (ref 0–2)
Renal Epithel, UA: NONE SEEN /hpf
WBC, UA: NONE SEEN /hpf (ref 0–5)

## 2020-10-28 LAB — URINALYSIS, ROUTINE W REFLEX MICROSCOPIC
Bilirubin, UA: NEGATIVE
Glucose, UA: NEGATIVE
Ketones, UA: NEGATIVE
Leukocytes,UA: NEGATIVE
Nitrite, UA: NEGATIVE
Protein,UA: NEGATIVE
Specific Gravity, UA: 1.02 (ref 1.005–1.030)
Urobilinogen, Ur: 0.2 mg/dL (ref 0.2–1.0)
pH, UA: 7 (ref 5.0–7.5)

## 2020-10-28 MED ORDER — TAMSULOSIN HCL 0.4 MG PO CAPS
0.4000 mg | ORAL_CAPSULE | Freq: Every day | ORAL | 0 refills | Status: AC
Start: 2020-10-28 — End: ?

## 2020-10-28 MED ORDER — TAMSULOSIN HCL 0.4 MG PO CAPS: 0.4000 mg | ORAL_CAPSULE | Freq: Two times a day (BID) | ORAL | 11 refills | Status: DC

## 2020-10-28 NOTE — Progress Notes (Signed)
10/28/2020 2:37 PM   [name] [birth year] [id number]  Referring provider: No referring provider defined for this encounter.  Followup nephrolithiasis  HPI: [name] is a 50yo here for followup for nephrolithiasis. He underwent right ureteroscopic stone extraction on 10/22/2020. He removed his stent POD#3. NO flank pain. No worsening LUTS. No hematuria. No complaints today.    PMH: No past medical history on file.  Surgical History: Past Surgical History:  Procedure Laterality Date  . CYSTOSCOPY WITH RETROGRADE PYELOGRAM, URETEROSCOPY AND STENT PLACEMENT Right 09/04/2020   Procedure: CYSTOSCOPY WITH RETROGRADE PYELOGRAM, URETEROSCOPY AND STENT PLACEMENT;  Surgeon: Malen Gauze, MD;  Location: [facility];  Service: Urology;  Laterality: Right;  . CYSTOSCOPY WITH RETROGRADE PYELOGRAM, URETEROSCOPY AND STENT PLACEMENT Right 10/22/2020   Procedure: CYSTOSCOPY WITH RIGHT RETROGRADE PYELOGRAM, RIGHT URETEROSCOPY AND RIGHT STENT EXCHANGE;  Surgeon: Malen Gauze, MD;  Location: [facility];  Service: Urology;  Laterality: Right;  . HOLMIUM LASER APPLICATION Right 09/04/2020   Procedure: HOLMIUM LASER APPLICATION;  Surgeon: Malen Gauze, MD;  Location: [facility];  Service: Urology;  Laterality: Right;  . HOLMIUM LASER APPLICATION Right 10/22/2020   Procedure: HOLMIUM LASER APPLICATION RIGHT RENAL CALCULI;  Surgeon: Malen Gauze, MD;  Location: [facility];  Service: Urology;  Laterality: Right;  . TONSILLECTOMY      Home Medications:  Allergies as of 10/28/2020   No Known Allergies     Medication List       Accurate as of October 28, 2020  2:37 PM. If you have any questions, ask your nurse or doctor.        ondansetron 4 MG tablet Commonly known as: Zofran Take 1 tablet (4 mg total) by mouth daily as needed for nausea or vomiting.   oxyCODONE-acetaminophen 5-325 MG tablet Commonly known as: Percocet Take 1 tablet by mouth every 4 (four) hours as needed for severe  pain.   tamsulosin 0.4 MG Caps capsule Commonly known as: Flomax Take 1 capsule (0.4 mg total) by mouth daily after supper.       Allergies: No Known Allergies  Family History: Family History  Problem Relation Age of Onset  . Heart attack Father     Social History:  reports that he has never smoked. He has never used smokeless tobacco. He reports previous alcohol use. He reports previous drug use.  ROS: All other review of systems were reviewed and are negative except what is noted above in HPI  Physical Exam: BP 124/90   Pulse (!) 130   Temp 98.7 F (37.1 C)   Ht 5' 7.75" (1.721 m)   Wt 274 lb 8 oz (124.5 kg)   BMI 42.05 kg/m   Constitutional:  Alert and oriented, No acute distress. HEENT: [place] AT, moist mucus membranes.  Trachea midline, no masses. Cardiovascular: No clubbing, cyanosis, or edema. Respiratory: Normal respiratory effort, no increased work of breathing. GI: Abdomen is soft, nontender, nondistended, no abdominal masses GU: No CVA tenderness.  Lymph: No cervical or inguinal lymphadenopathy. Skin: No rashes, bruises or suspicious lesions. Neurologic: Grossly intact, no focal deficits, moving all 4 extremities. Psychiatric: Normal mood and affect.  Laboratory Data: No results found for: WBC, HGB, HCT, MCV, PLT  No results found for: CREATININE  No results found for: PSA  No results found for: TESTOSTERONE  No results found for: HGBA1C  Urinalysis    Component Value Date/Time   APPEARANCEUR Cloudy (A) 09/18/2020 1452   GLUCOSEU Negative 09/18/2020 1452   BILIRUBINUR  Negative 09/18/2020 1452   PROTEINUR 2+ (A) 09/18/2020 1452   NITRITE Negative 09/18/2020 1452   LEUKOCYTESUR 1+ (A) 09/18/2020 1452    Lab Results  Component Value Date   LABMICR See below: 09/18/2020   WBCUA 6-10 (A) 09/18/2020   LABEPIT 0-10 09/18/2020   MUCUS Present 09/18/2020   BACTERIA Moderate (A) 09/18/2020    Pertinent Imaging:  Results for orders placed during  the hospital encounter of 09/18/20  DG Abd 1 View  Narrative CLINICAL DATA:  Right nephrolithiasis status post stent placement and laser treatment 2 weeks prior with persistent right-sided pain.  EXAM: ABDOMEN - 1 VIEW  COMPARISON:  07/21/2020 abdominal radiograph  FINDINGS: Right nephroureteral stent appears well positioned with the proximal pigtail portion in the expected location of the right renal pelvis and distal pigtail portion in the expected location of the left bladder. Numerous radiopaque stones throughout the right renal collecting system, largest 19 mm in the upper right kidney and 11 mm in 10 mm in the interpolar right kidney. Radiopaque 8 mm mid to upper left renal stone. Possible 4 mm stone fragment adjacent to the right ureteral stent at the upper L4 level. No radiopaque stones in the expected locations of the left ureter or bladder. No dilated small bowel loops. Mild colonic stool. No evidence of pneumatosis or pneumoperitoneum. Mild lumbar spondylosis.  IMPRESSION: 1. Right nephroureteral stent appears well positioned. Possible 4 mm stone fragment adjacent to the right ureteral stent at the upper L4 level. 2. Several right renal stones as detailed.   Electronically Signed By: Delbert Phenix M.D. On: 09/19/2020 15:38  No results found for this or any previous visit.  No results found for this or any previous visit.  No results found for this or any previous visit.  No results found for this or any previous visit.  No results found for this or any previous visit.  No results found for this or any previous visit.  No results found for this or any previous visit.   Assessment & Plan:   1. Nephrolithiasis: -RTC 1 month with renal US  No follow-ups on file.  Wilkie Aye, MD  [facility] [place]

## 2020-10-28 NOTE — Patient Instructions (Signed)

## 2020-10-28 NOTE — Progress Notes (Signed)
Urological Symptom Review  Patient is experiencing the following symptoms: negative   Review of Systems  Gastrointestinal (upper)  : Negative for upper GI symptoms  Gastrointestinal (lower) : Negative for lower GI symptoms  Constitutional : Negative for symptoms  Skin: Negative for skin symptoms  Eyes: Negtive for symptoms  Ear/Nose/Throat : Negative for Ear/Nose/Throat symptoms  Hematologic/Lymphatic:   Cardiovascular : Chest pain Negative for cardiovascular symptoms  Respiratory : Negative for respiratory symptoms  Endocrine: Negative for endocrine symptoms  Musculoskeletal: Back pain Joint pain  Neurological: Headaches  Psychologic: Negative for psychiatric symptoms

## 2020-11-25 ENCOUNTER — Ambulatory Visit (HOSPITAL_COMMUNITY): Payer: Medicaid Other

## 2020-11-30 ENCOUNTER — Ambulatory Visit (HOSPITAL_COMMUNITY)
Admission: RE | Admit: 2020-11-30 | Discharge: 2020-11-30 | Disposition: A | Discharge status: Home / Self Care | Payer: Self-pay | Source: Ambulatory Visit | Attending: Urology | Admitting: Urology

## 2020-11-30 ENCOUNTER — Other Ambulatory Visit: Payer: Self-pay

## 2020-11-30 ENCOUNTER — Encounter: Payer: Self-pay | Admitting: Urology

## 2020-11-30 ENCOUNTER — Ambulatory Visit (INDEPENDENT_AMBULATORY_CARE_PROVIDER_SITE_OTHER): Payer: Medicaid Other | Admitting: Urology

## 2020-11-30 VITALS — BP 136/101 | HR 109 | Wt 280.0 lb

## 2020-11-30 DIAGNOSIS — N200 Calculus of kidney: Secondary | ICD-10-CM | POA: Insufficient documentation

## 2020-11-30 NOTE — Progress Notes (Signed)
Urological Symptom Review  Patient is experiencing the following symptoms: none   Review of Systems  Gastrointestinal (upper)  : Negative for upper GI symptoms  Gastrointestinal (lower) : Negative for lower GI symptoms  Constitutional : Negative for symptoms  Skin: Negative for skin symptoms  Eyes: negative  Ear/Nose/Throat : Negative for Ear/Nose/Throat symptoms  Hematologic/Lymphatic: Negative for Hematologic/Lymphatic symptoms  Cardiovascular : Negative for cardiovascular symptoms  Respiratory : Negative for respiratory symptoms  Endocrine: Negative for endocrine symptoms  Musculoskeletal: Negative for musculoskeletal symptoms  Neurological: Negative for neurological symptoms  Psychologic: Negative for psychiatric symptoms  

## 2020-11-30 NOTE — Patient Instructions (Signed)

## 2020-11-30 NOTE — Progress Notes (Signed)
11/30/2020 3:50 PM   William Anthony 08/18/1970 [id number]  Referring provider: No referring provider defined for this encounter.  followup nephrolithiasis  HPI: [name] is a 50yo here for followup for nephrolithiasis. Renal US today shows no right hydronephrosis. No flank pain. No LUTS. NO hematuria. He has not had a metabolic evaluation.     PMH: No past medical history on file.  Surgical History: Past Surgical History:  Procedure Laterality Date  . CYSTOSCOPY WITH RETROGRADE PYELOGRAM, URETEROSCOPY AND STENT PLACEMENT Right 09/04/2020   Procedure: CYSTOSCOPY WITH RETROGRADE PYELOGRAM, URETEROSCOPY AND STENT PLACEMENT;  Surgeon: Malen Gauze, MD;  Location: [facility];  Service: Urology;  Laterality: Right;  . CYSTOSCOPY WITH RETROGRADE PYELOGRAM, URETEROSCOPY AND STENT PLACEMENT Right 10/22/2020   Procedure: CYSTOSCOPY WITH RIGHT RETROGRADE PYELOGRAM, RIGHT URETEROSCOPY AND RIGHT STENT EXCHANGE;  Surgeon: Malen Gauze, MD;  Location: [facility];  Service: Urology;  Laterality: Right;  . HOLMIUM LASER APPLICATION Right 09/04/2020   Procedure: HOLMIUM LASER APPLICATION;  Surgeon: Malen Gauze, MD;  Location: [facility];  Service: Urology;  Laterality: Right;  . HOLMIUM LASER APPLICATION Right 10/22/2020   Procedure: HOLMIUM LASER APPLICATION RIGHT RENAL CALCULI;  Surgeon: Malen Gauze, MD;  Location: [facility];  Service: Urology;  Laterality: Right;  . TONSILLECTOMY      Home Medications:  Allergies as of 11/30/2020   No Known Allergies     Medication List       Accurate as of Nov 30, 2020  3:50 PM. If you have any questions, ask your nurse or doctor.        ondansetron 4 MG tablet Commonly known as: Zofran Take 1 tablet (4 mg total) by mouth daily as needed for nausea or vomiting.   oxyCODONE-acetaminophen 5-325 MG tablet Commonly known as: Percocet Take 1 tablet by mouth every 4 (four) hours as needed for severe pain.   tamsulosin 0.4 MG Caps  capsule Commonly known as: Flomax Take 1 capsule (0.4 mg total) by mouth daily after supper.       Allergies: No Known Allergies  Family History: Family History  Problem Relation Age of Onset  . Heart attack Father     Social History:  reports that he has never smoked. He has never used smokeless tobacco. He reports previous alcohol use. He reports previous drug use.  ROS: All other review of systems were reviewed and are negative except what is noted above in HPI  Physical Exam: BP (!) 136/101   Pulse (!) 109   Wt 280 lb (127 kg)   BMI 42.89 kg/m   Constitutional:  Alert and oriented, No acute distress. HEENT: [place] AT, moist mucus membranes.  Trachea midline, no masses. Cardiovascular: No clubbing, cyanosis, or edema. Respiratory: Normal respiratory effort, no increased work of breathing. GI: Abdomen is soft, nontender, nondistended, no abdominal masses GU: No CVA tenderness.  Lymph: No cervical or inguinal lymphadenopathy. Skin: No rashes, bruises or suspicious lesions. Neurologic: Grossly intact, no focal deficits, moving all 4 extremities. Psychiatric: Normal mood and affect.  Laboratory Data: No results found for: WBC, HGB, HCT, MCV, PLT  No results found for: CREATININE  No results found for: PSA  No results found for: TESTOSTERONE  No results found for: HGBA1C  Urinalysis    Component Value Date/Time   APPEARANCEUR Clear 10/28/2020 1505   GLUCOSEU Negative 10/28/2020 1505   BILIRUBINUR Negative 10/28/2020 1505   PROTEINUR Negative 10/28/2020 1505   NITRITE Negative 10/28/2020 1505   LEUKOCYTESUR  Negative 10/28/2020 1505    Lab Results  Component Value Date   LABMICR See below: 10/28/2020   WBCUA None seen 10/28/2020   LABEPIT None seen 10/28/2020   MUCUS Present 09/18/2020   BACTERIA None seen 10/28/2020    Pertinent Imaging: Renal US: Images reviewed and discussed with the patient Results for orders placed during the hospital encounter of  09/18/20  DG Abd 1 View  Narrative CLINICAL DATA:  Right nephrolithiasis status post stent placement and laser treatment 2 weeks prior with persistent right-sided pain.  EXAM: ABDOMEN - 1 VIEW  COMPARISON:  07/21/2020 abdominal radiograph  FINDINGS: Right nephroureteral stent appears well positioned with the proximal pigtail portion in the expected location of the right renal pelvis and distal pigtail portion in the expected location of the left bladder. Numerous radiopaque stones throughout the right renal collecting system, largest 19 mm in the upper right kidney and 11 mm in 10 mm in the interpolar right kidney. Radiopaque 8 mm mid to upper left renal stone. Possible 4 mm stone fragment adjacent to the right ureteral stent at the upper L4 level. No radiopaque stones in the expected locations of the left ureter or bladder. No dilated small bowel loops. Mild colonic stool. No evidence of pneumatosis or pneumoperitoneum. Mild lumbar spondylosis.  IMPRESSION: 1. Right nephroureteral stent appears well positioned. Possible 4 mm stone fragment adjacent to the right ureteral stent at the upper L4 level. 2. Several right renal stones as detailed.   Electronically Signed By: Delbert Phenix M.D. On: 09/19/2020 15:38  No results found for this or any previous visit.  No results found for this or any previous visit.  No results found for this or any previous visit.  Results for orders placed during the hospital encounter of 11/30/20  Ultrasound renal complete  Narrative CLINICAL DATA:  Follow-up nephrolithiasis  EXAM: RENAL / URINARY TRACT ULTRASOUND COMPLETE  COMPARISON:  Radiograph 09/18/2020  FINDINGS: Right Kidney:  Renal measurements: 13.3 x 5.9 x 6 cm = volume: 248.5 mL. Cortical echogenicity within normal limits. No hydronephrosis. Cyst in the upper pole measuring 2 by 1.7 x 1.8 cm. Echogenic area with shadowing at the mid to upper pole measuring 26 mm,  indeterminate for cortical calcification or kidney stone with overlying cortical thinning.  Left Kidney:  Renal measurements: 13.4 x 5.8 x 5.2 cm = volume: 208.4 mL. Echogenicity within normal limits. No hydronephrosis. Cyst in the lower pole measuring 2.3 x 2.3 x 2.4 cm.  Bladder:  Echogenic area at the right posterior bladder.  This measures 8 mm.  Other:  None.  IMPRESSION: 1. Negative for hydronephrosis. 2. 26 mm echogenic area with shadowing at the mid to upper pole of right kidney which may reflect large kidney stone. 3. 8 mm echogenic area in the region of right UVJ is indeterminate for UVJ stone.   Electronically Signed By: Jasmine Pang M.D. On: 11/30/2020 15:44  No results found for this or any previous visit.  No results found for this or any previous visit.  No results found for this or any previous visit.   Assessment & Plan:    1. Nephrolithiasis  No follow-ups on file.  Wilkie Aye, MD  [facility] [place]

## 2020-12-01 LAB — URIC ACID: Uric Acid: 6.7 mg/dL (ref 3.8–8.4)

## 2020-12-01 LAB — PTH, INTACT AND CALCIUM
Calcium: 10 mg/dL (ref 8.7–10.2)
PTH: 24 pg/mL (ref 15–65)

## 2020-12-01 LAB — BASIC METABOLIC PANEL
BUN/Creatinine Ratio: 16 (ref 9–20)
BUN: 14 mg/dL (ref 6–24)
CO2: 23 mmol/L (ref 20–29)
Calcium: 10.4 mg/dL — ABNORMAL HIGH (ref 8.7–10.2)
Chloride: 100 mmol/L (ref 96–106)
Creatinine, Ser: 0.88 mg/dL (ref 0.76–1.27)
Glucose: 110 mg/dL — ABNORMAL HIGH (ref 65–99)
Potassium: 5.3 mmol/L — ABNORMAL HIGH (ref 3.5–5.2)
Sodium: 142 mmol/L (ref 134–144)
eGFR: 105 mL/min/{1.73_m2} (ref >59)

## 2020-12-03 ENCOUNTER — Other Ambulatory Visit: Payer: Self-pay | Admitting: Urology

## 2020-12-03 ENCOUNTER — Telehealth: Payer: Self-pay

## 2020-12-03 NOTE — Telephone Encounter (Signed)
-----   Message from Malen Gauze, MD sent at 12/03/2020  7:57 AM EDT ----- He has a high calcium with high normal PTH. I am referring him to general surgery ----- Message ----- From: Gustavus Messing, LPN Sent: [phone]   8:24 AM EDT To: Malen Gauze, MD  Please review

## 2020-12-03 NOTE — Telephone Encounter (Signed)
Patient called and made aware.

## 2020-12-15 ENCOUNTER — Other Ambulatory Visit: Payer: Self-pay

## 2021-02-26 ENCOUNTER — Ambulatory Visit (HOSPITAL_COMMUNITY): Payer: Self-pay

## 2021-03-05 ENCOUNTER — Ambulatory Visit: Payer: Medicaid Other | Admitting: Urology

## 2022-02-06 IMAGING — DX DG ABDOMEN 1V
2 series · 2 of 2 positions shown · non-contrast
Comparison: 07/21/2020 abdominal radiograph

CLINICAL DATA: Right nephrolithiasis status post stent placement
and laser treatment 2 weeks prior with persistent right-sided pain.

EXAM:
ABDOMEN - 1 VIEW

[abdomen kub (1 of 2)]
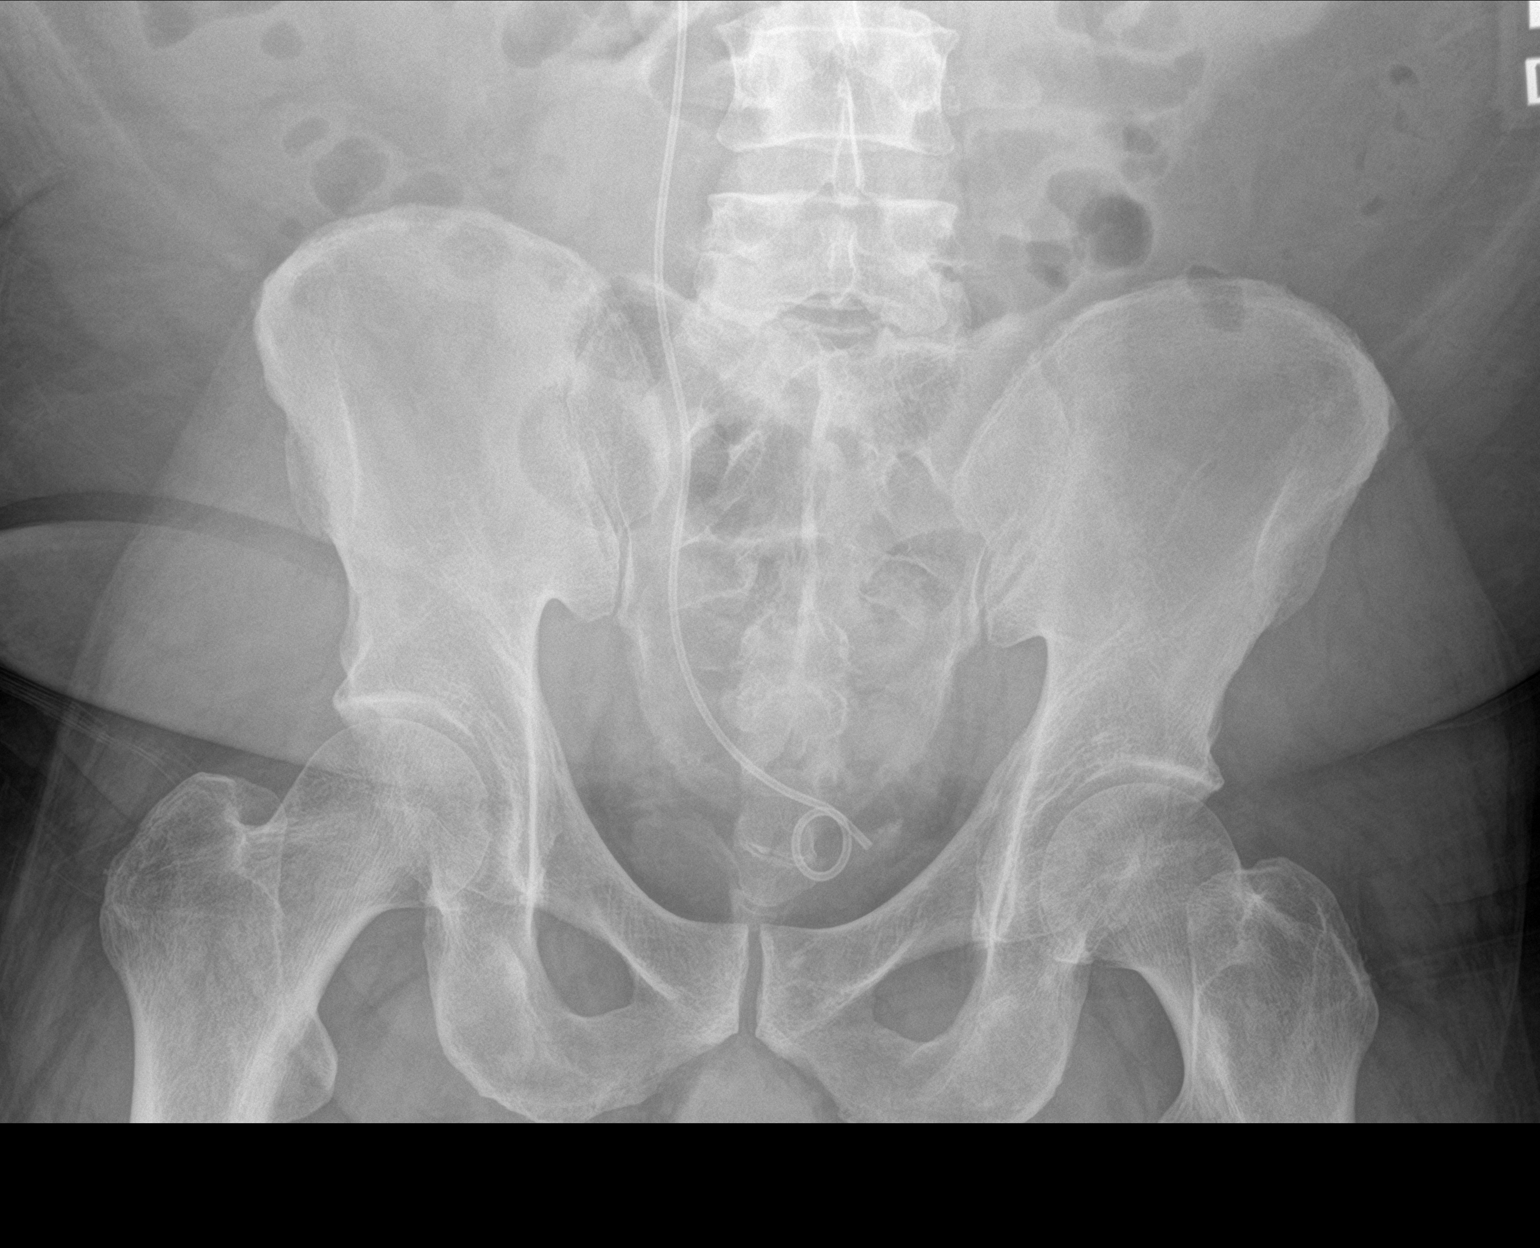

[abdomen kub (2 of 2)]
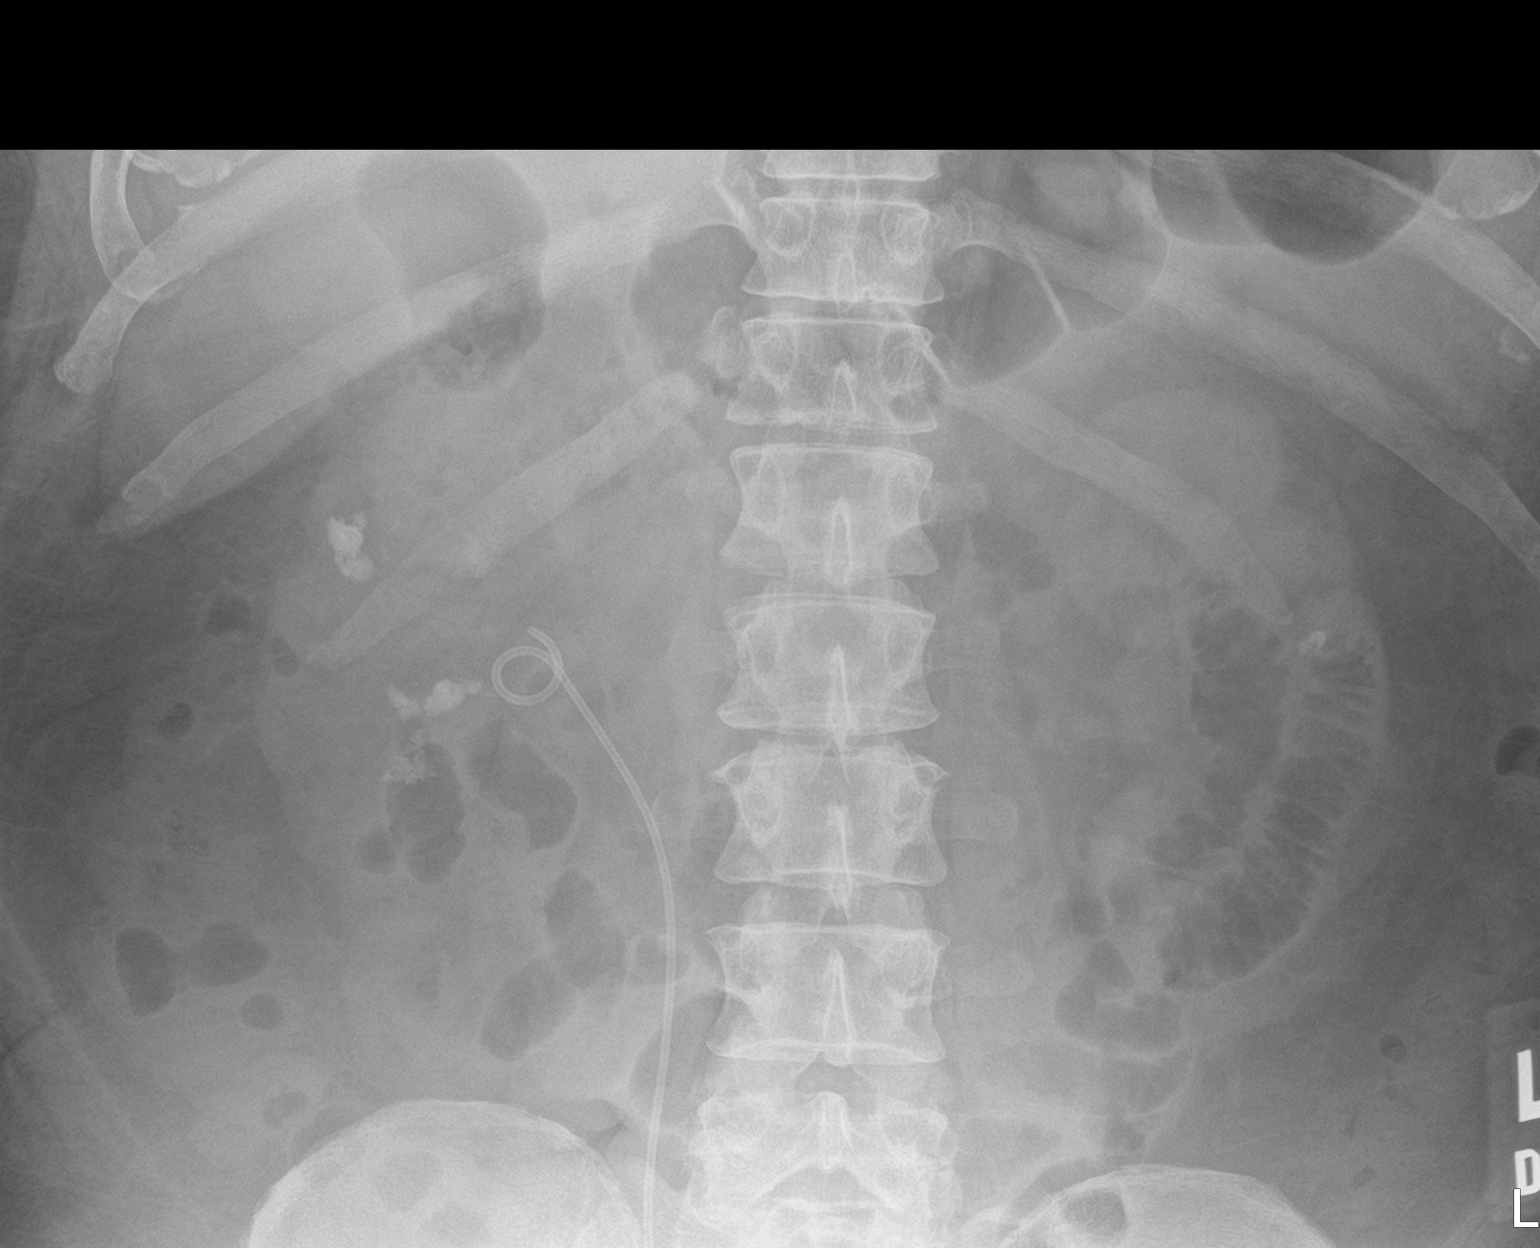

[2 of 2 positions shown; findings below may reference images not displayed]

FINDINGS: Right nephroureteral stent appears well positioned with the proximal
pigtail portion in the expected location of the right renal pelvis
and distal pigtail portion in the expected location of the left
bladder. Numerous radiopaque stones throughout the right renal
collecting system, largest 19 mm in the upper right kidney and 11 mm
in 10 mm in the interpolar right kidney. Radiopaque 8 mm mid to
upper left renal stone. Possible 4 mm stone fragment adjacent to the
right ureteral stent at the upper L4 level. No radiopaque stones in
the expected locations of the left ureter or bladder. No dilated
small bowel loops. Mild colonic stool. No evidence of pneumatosis or
pneumoperitoneum. Mild lumbar spondylosis.
IMPRESSION: 1. Right nephroureteral stent appears well positioned. Possible 4 mm
stone fragment adjacent to the right ureteral stent at the upper L4
level.
2. Several right renal stones as detailed.

## 2022-04-20 IMAGING — US US RENAL
1 series · 14 of 25 positions shown · non-contrast
Comparison: Radiograph 09/18/2020

CLINICAL DATA: Follow-up nephrolithiasis

EXAM:
RENAL / URINARY TRACT ULTRASOUND COMPLETE

[Series 1: us renal · 14 of 74 slices shown]
[im 1/74]
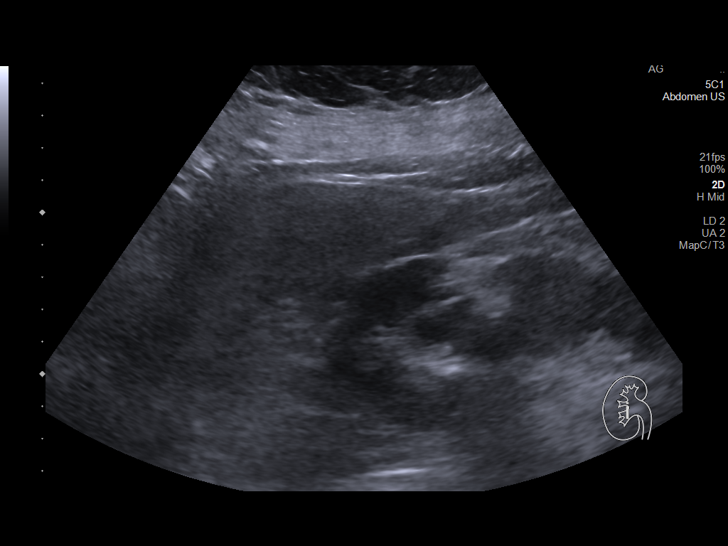
[im 7/74]
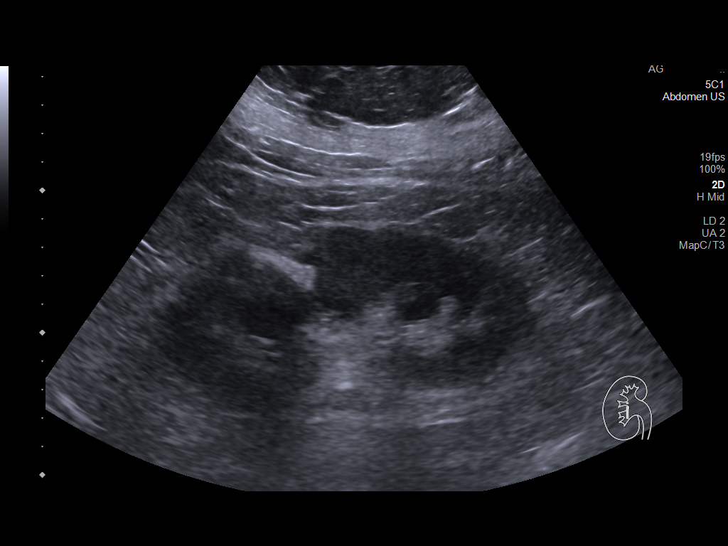
[im 13/74]
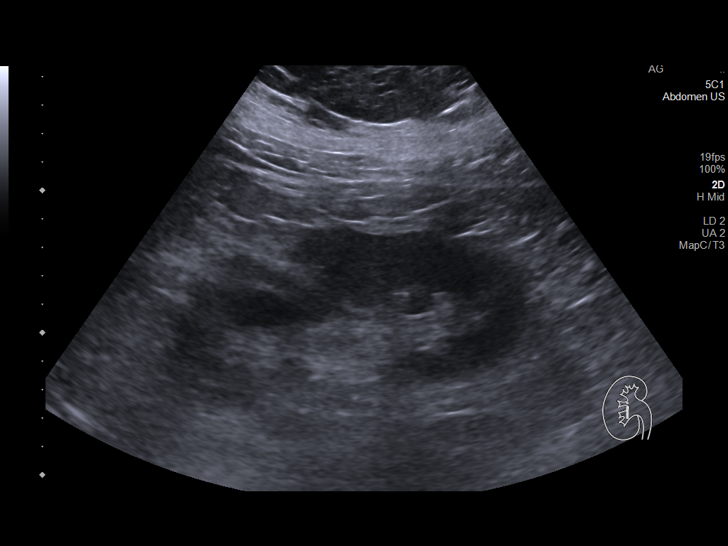
[im 19/74]
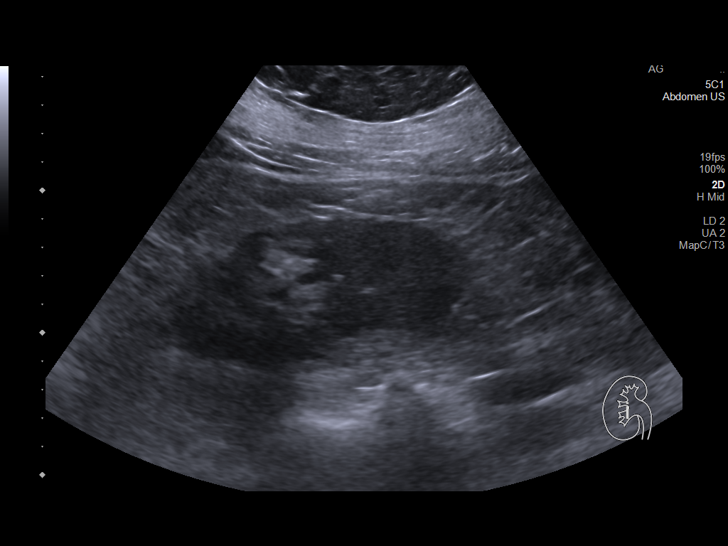
[im 25/74]
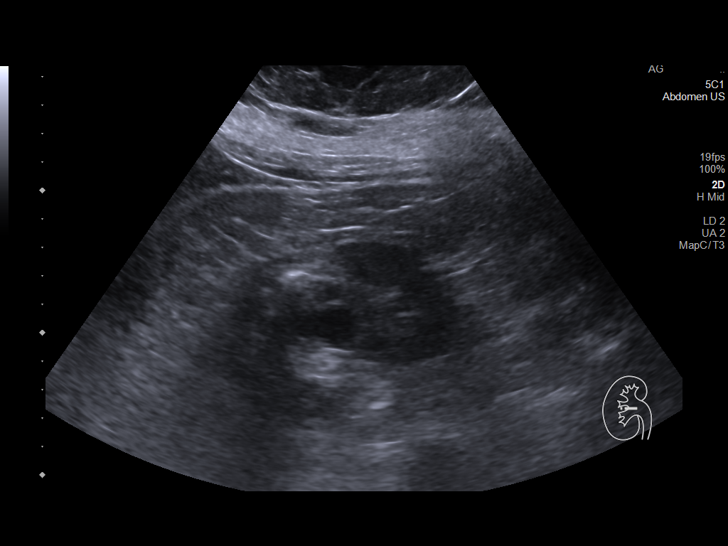
[im 28/74]
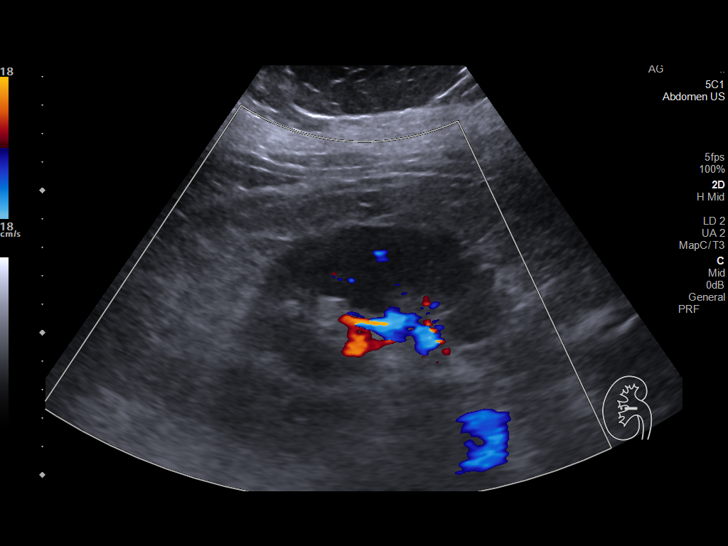
[im 34/74]
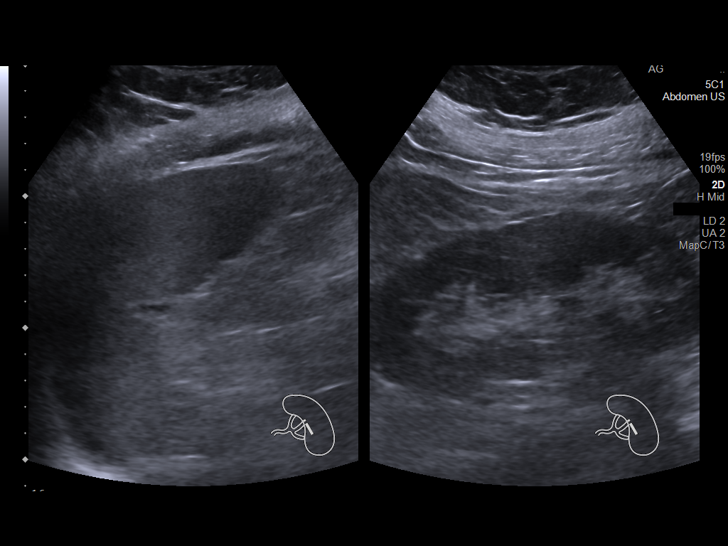
[im 40/74]
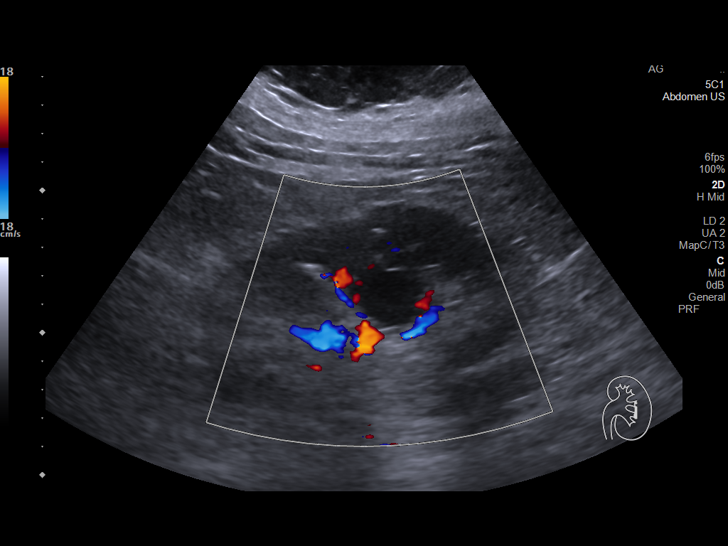
[im 46/74]
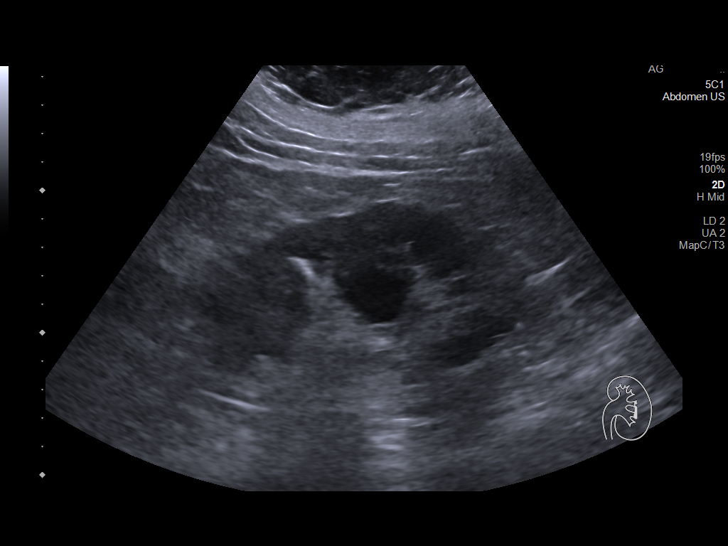
[im 49/74]
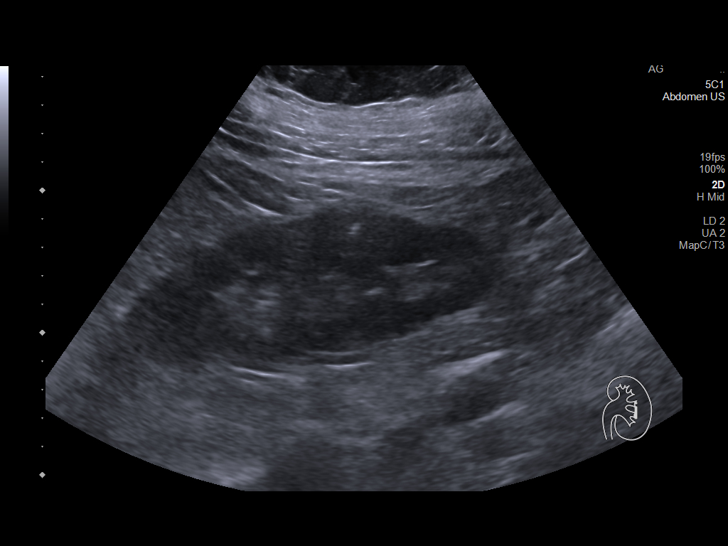
[im 55/74]
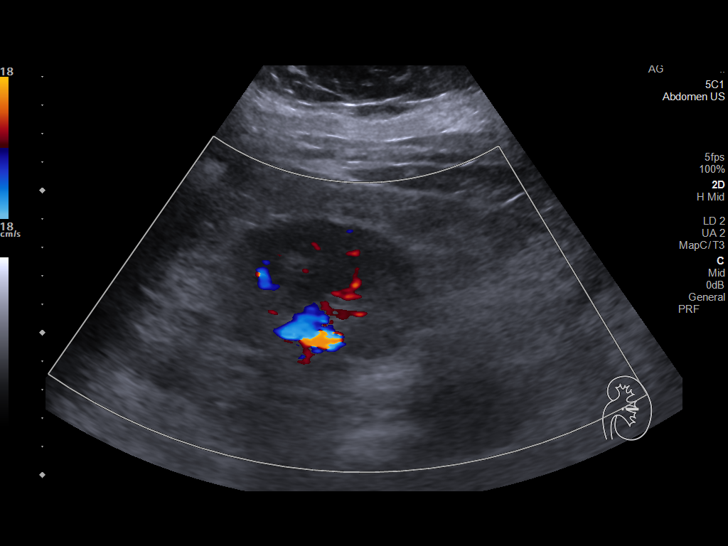
[im 61/74]
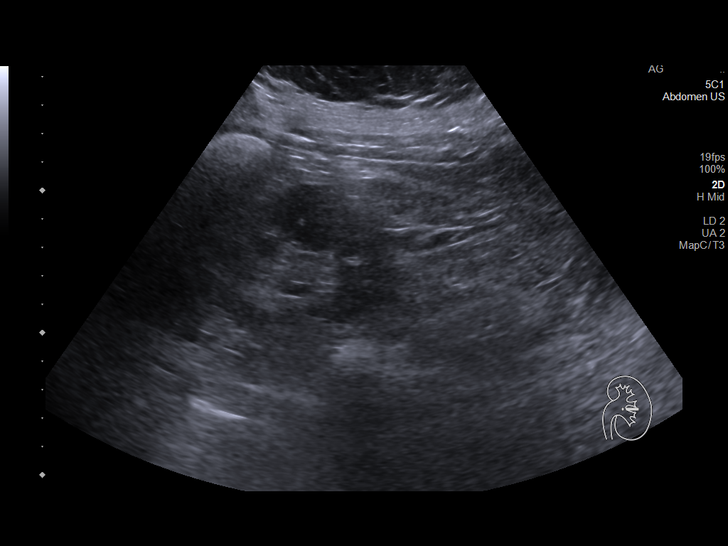
[im 67/74]
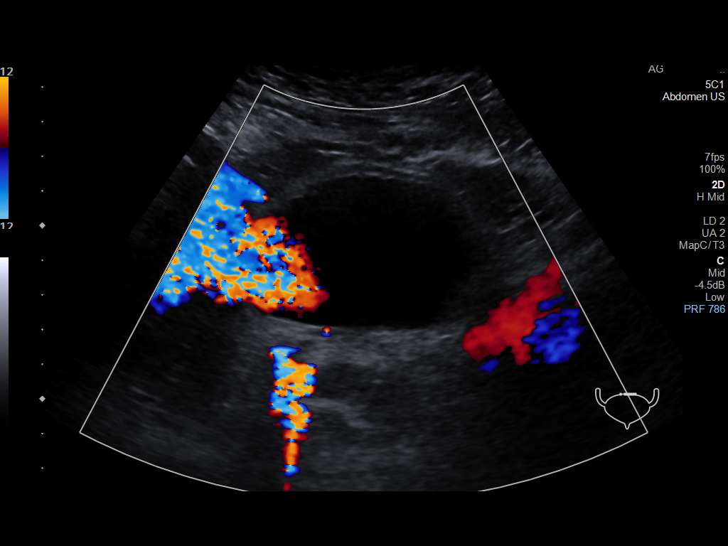
[im 74/74]
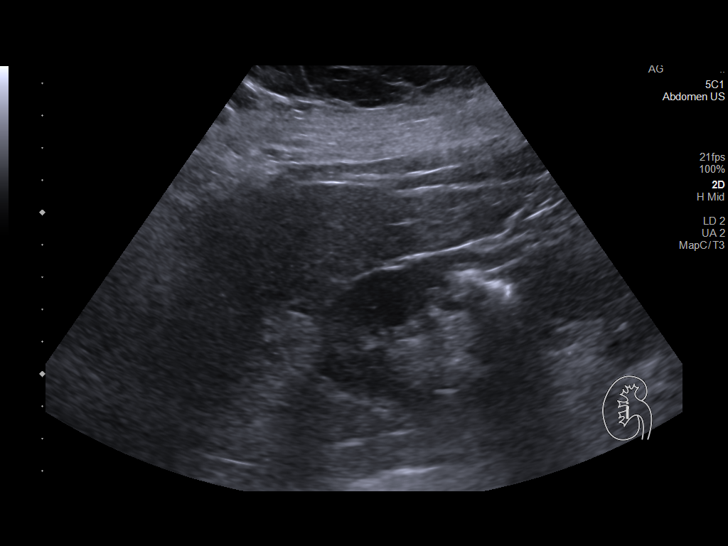

[14 of 25 positions shown; findings below may reference images not displayed]

FINDINGS: Right Kidney:

Renal measurements: 13.3 x 5.9 x 6 cm = volume: 248.5 mL. Cortical
echogenicity within normal limits. No hydronephrosis. Cyst in the
upper pole measuring 2 by 1.7 x 1.8 cm. Echogenic area with
shadowing at the mid to upper pole measuring 26 mm, indeterminate
for cortical calcification or kidney stone with overlying cortical
thinning.

Left Kidney:

Renal measurements: 13.4 x 5.8 x 5.2 cm = volume: 208.4 mL.
Echogenicity within normal limits. No hydronephrosis. Cyst in the
lower pole measuring 2.3 x 2.3 x 2.4 cm.

Bladder:

Echogenic area at the right posterior bladder.  This measures 8 mm.

Other:

None.
IMPRESSION: 1. Negative for hydronephrosis.
2. 26 mm echogenic area with shadowing at the mid to upper pole of
right kidney which may reflect large kidney stone.
3. 8 mm echogenic area in the region of right UVJ is indeterminate
for UVJ stone.
